# Patient Record
Sex: Male | Born: 1970 | Race: Black or African American | Hispanic: No | Marital: Married | Smoking: Current every day smoker
Health system: Southern US, Community
[De-identification: ages and names within clinical notes are randomized; demographics above are authoritative.]

## PROBLEM LIST (undated history)

## (undated) DIAGNOSIS — R0602 Shortness of breath: Secondary | ICD-10-CM

## (undated) DIAGNOSIS — R002 Palpitations: Principal | ICD-10-CM

## (undated) DIAGNOSIS — Z720 Tobacco use: Secondary | ICD-10-CM

## (undated) DIAGNOSIS — G4730 Sleep apnea, unspecified: Secondary | ICD-10-CM

## (undated) HISTORY — DX: Shortness of breath: R06.02

## (undated) HISTORY — DX: Sleep apnea, unspecified: G47.30

## (undated) HISTORY — DX: Tobacco use: Z72.0

## (undated) HISTORY — DX: Palpitations: R00.2

---

## 2006-02-04 ENCOUNTER — Emergency Department (HOSPITAL_COMMUNITY): Admission: EM | Admit: 2006-02-04 | Discharge: 2006-02-04 | Payer: Self-pay | Admitting: Emergency Medicine

## 2014-04-25 ENCOUNTER — Other Ambulatory Visit: Payer: Self-pay | Admitting: Gastroenterology

## 2014-04-25 DIAGNOSIS — R1084 Generalized abdominal pain: Secondary | ICD-10-CM

## 2014-05-01 ENCOUNTER — Ambulatory Visit
Admission: RE | Admit: 2014-05-01 | Discharge: 2014-05-01 | Disposition: A | Discharge status: Home / Self Care | Payer: Federal, State, Local not specified - PPO | Source: Ambulatory Visit | Attending: Gastroenterology | Admitting: Gastroenterology

## 2014-05-01 DIAGNOSIS — R1084 Generalized abdominal pain: Secondary | ICD-10-CM

## 2014-05-01 MED ORDER — IOHEXOL 300 MG/ML  SOLN
100.0000 mL | Freq: Once | INTRAMUSCULAR | Status: AC | PRN
  Administered 2014-05-01: 100 mL via INTRAVENOUS

## 2015-05-15 IMAGING — CT CT ABD-PELV W/ CM
2 of 5 series · 17 of 46 positions shown, 19 images · IV contrast ([ID] OMNI 300)
Comparison: None.

CLINICAL DATA: Mid abdominal pain.  Weight loss.  Diarrhea.

EXAM:
CT ABDOMEN AND PELVIS WITH CONTRAST
TECHNIQUE: Multidetector CT imaging of the abdomen and pelvis was performed
using the standard protocol following bolus administration of
intravenous contrast.
CONTRAST:  100mL OMNIPAQUE IOHEXOL 300 MG/ML  SOLN

[Series 2: abd/pelvis with · axial · 0.70mm/px · z∈[-366,+24]mm · 14 of 89 slices shown, 16 images]
[im 6/89  soft-tissue]
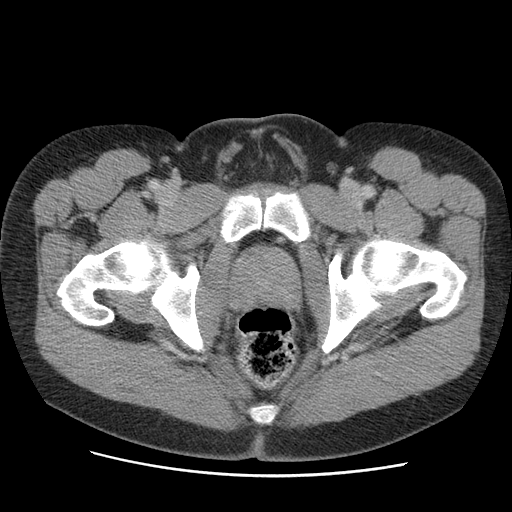
[im 6/89  bone]
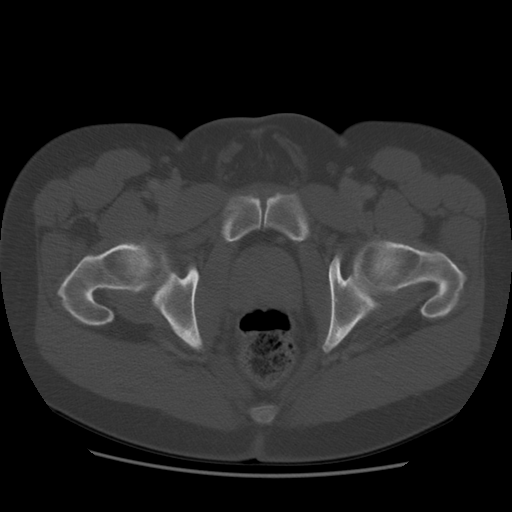
[im 11/89  soft-tissue]
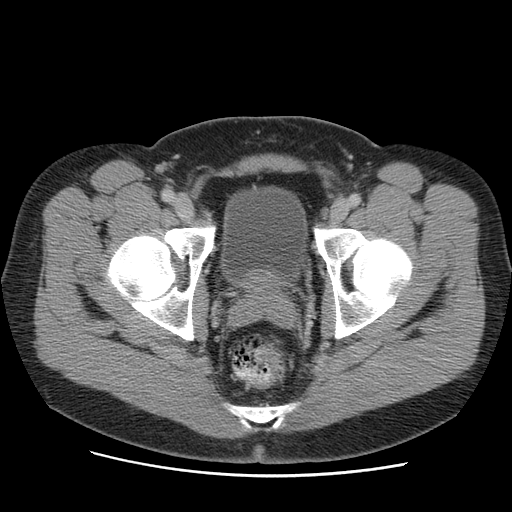
[im 16/89  soft-tissue]
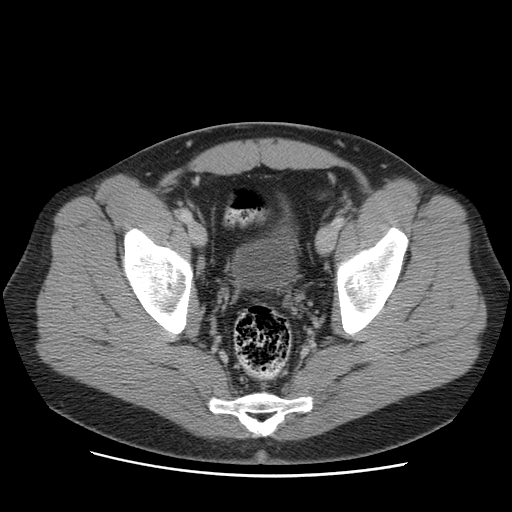
[im 26/89  soft-tissue]
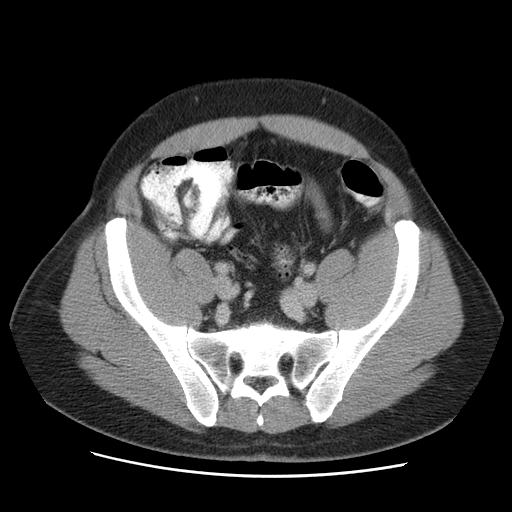
[im 32/89  soft-tissue]
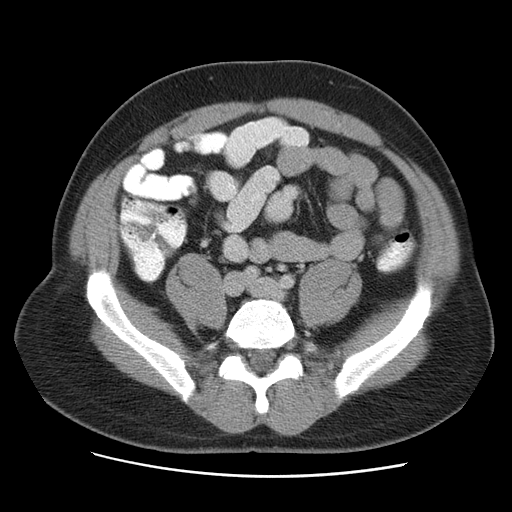
[im 37/89  soft-tissue]
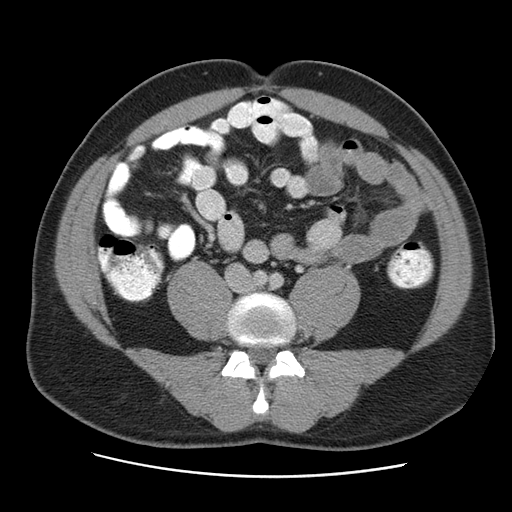
[im 42/89  soft-tissue]
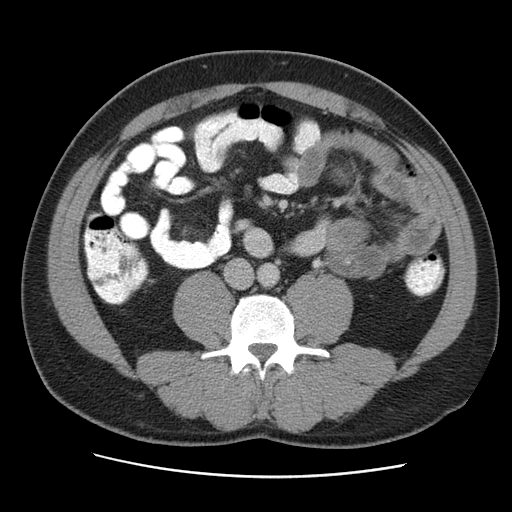
[im 47/89  soft-tissue]
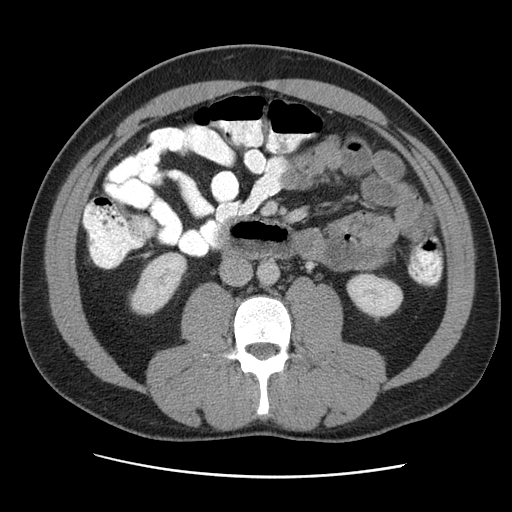
[im 52/89  soft-tissue]
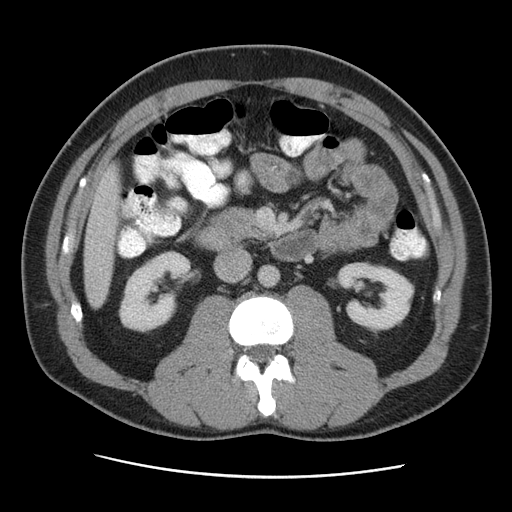
[im 52/89  bone]
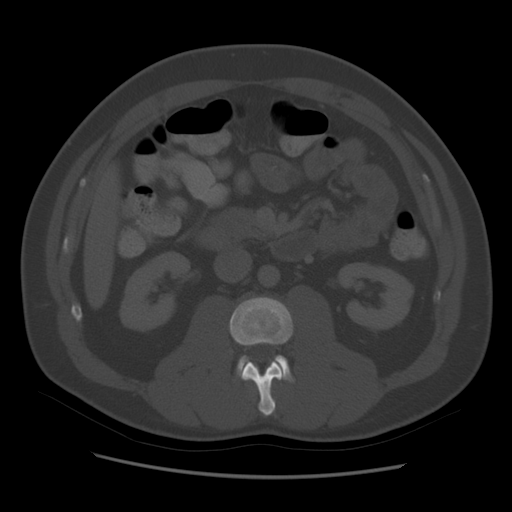
[im 57/89  soft-tissue]
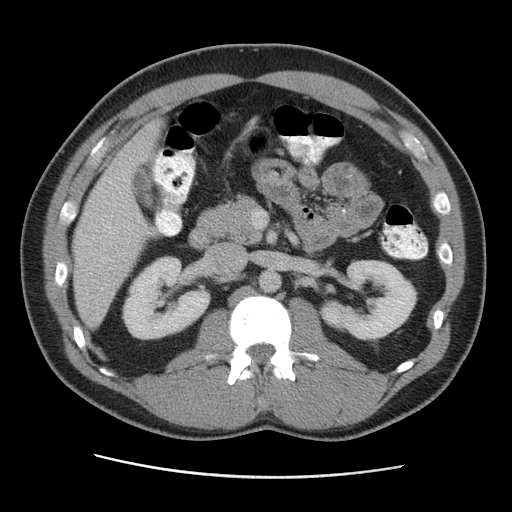
[im 68/89  soft-tissue]
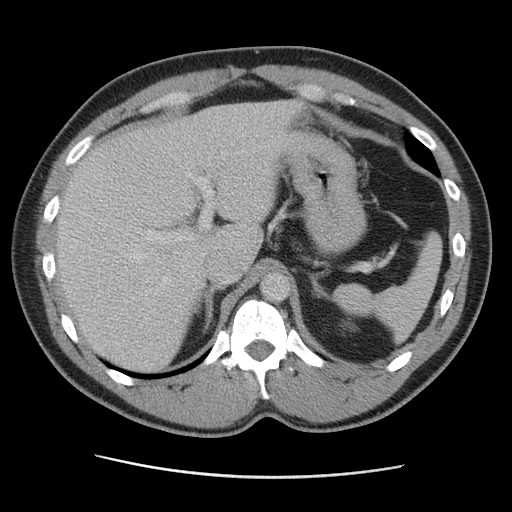
[im 73/89  soft-tissue]
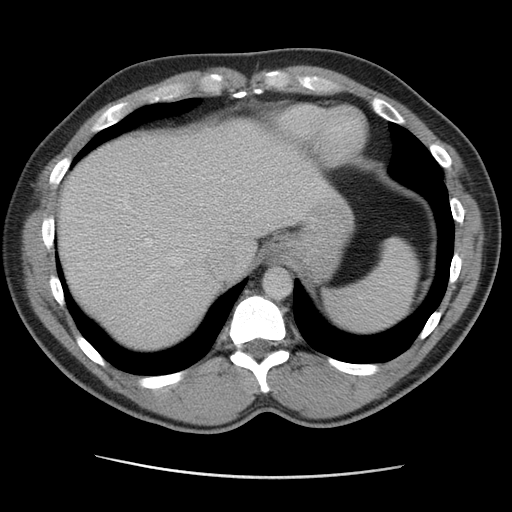
[im 78/89  soft-tissue]
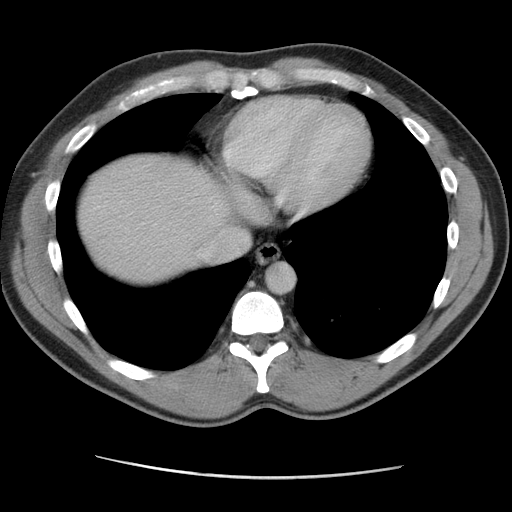
[im 83/89  soft-tissue]
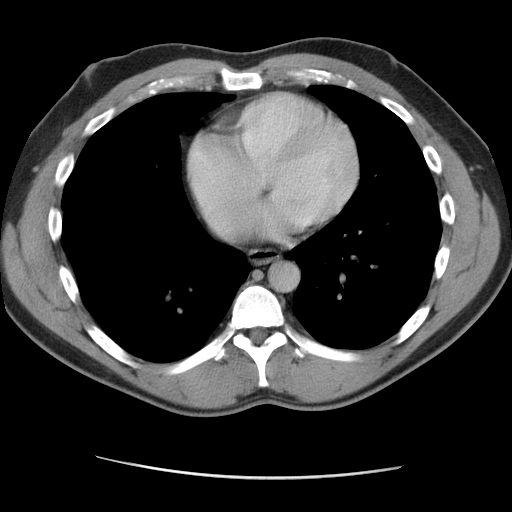

[Series 400: cor · coronal · 0.99mm/px · 3 of 136 slices shown]
[im 46/136  soft-tissue]
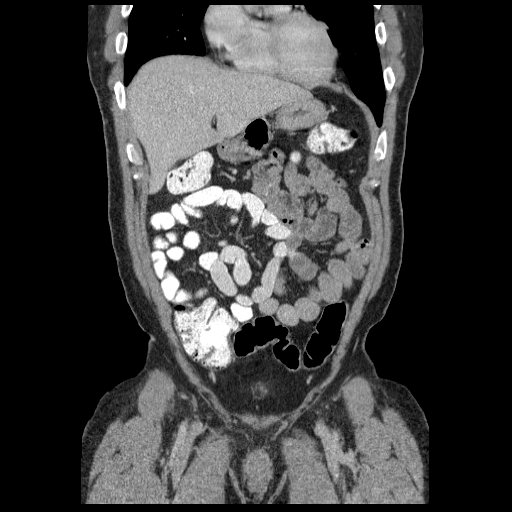
[im 61/136  soft-tissue]
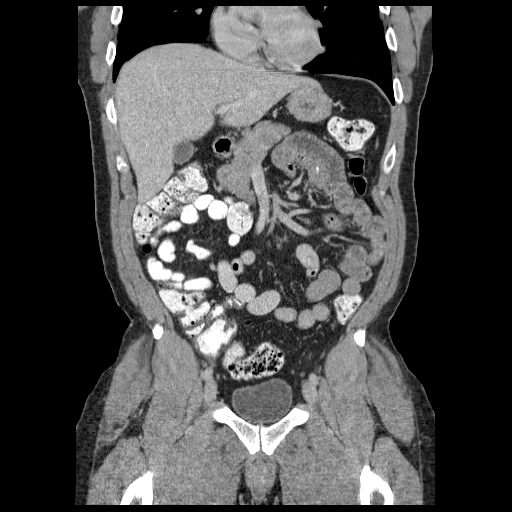
[im 76/136  soft-tissue]
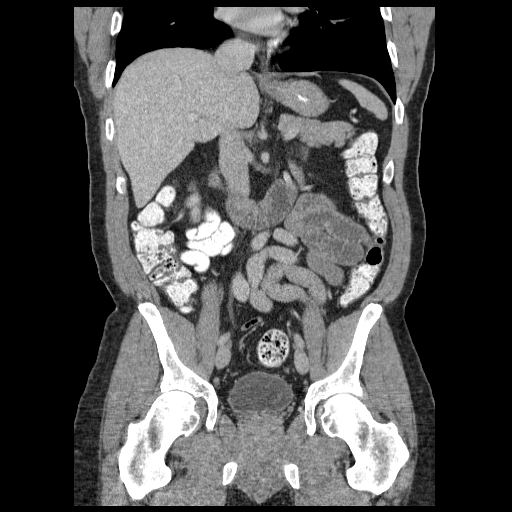

[17 of 46 positions shown; findings below may reference images not displayed]

FINDINGS: Lower chest:  Unremarkable

Hepatobiliary: Unremarkable

Pancreas: Unremarkable

Spleen: Unremarkable

Adrenals/Urinary Tract: Unremarkable

Stomach/Bowel: Frothy air- fluid levels distally in the colon
indicating diarrheal process. No significant abnormal colonic wall
thickening. Appendix normal.

Vascular/Lymphatic: Unremarkable

Reproductive: Unremarkable

Other: No supplemental non-categorized findings.

Musculoskeletal: Chronic fragmented spurring along the anterior
superior acetabulum bilaterally. Otherwise unremarkable.
IMPRESSION: 1. Frothy air- fluid levels in the distal colon indicating diarrheal
process, without colonic wall thickening.

## 2015-12-03 DIAGNOSIS — J302 Other seasonal allergic rhinitis: Secondary | ICD-10-CM | POA: Diagnosis not present

## 2016-09-29 DIAGNOSIS — R0602 Shortness of breath: Secondary | ICD-10-CM | POA: Diagnosis not present

## 2016-09-29 DIAGNOSIS — F17290 Nicotine dependence, other tobacco product, uncomplicated: Secondary | ICD-10-CM | POA: Diagnosis not present

## 2016-09-29 DIAGNOSIS — R0789 Other chest pain: Secondary | ICD-10-CM | POA: Diagnosis not present

## 2016-09-29 DIAGNOSIS — Z23 Encounter for immunization: Secondary | ICD-10-CM | POA: Diagnosis not present

## 2016-09-29 DIAGNOSIS — Z0000 Encounter for general adult medical examination without abnormal findings: Secondary | ICD-10-CM | POA: Diagnosis not present

## 2016-10-05 ENCOUNTER — Telehealth: Payer: Self-pay | Admitting: Cardiovascular Disease

## 2016-10-05 NOTE — Telephone Encounter (Signed)
Received records from [facility] for appointment on 10/19/16 with Dr Duke Salviaandolph.  Records put with Dr Leonides Sakeandolph's schedule for 10/19/16. lp

## 2016-10-19 ENCOUNTER — Encounter: Payer: Self-pay | Admitting: Cardiovascular Disease

## 2016-10-19 ENCOUNTER — Ambulatory Visit (INDEPENDENT_AMBULATORY_CARE_PROVIDER_SITE_OTHER): Payer: Federal, State, Local not specified - PPO | Admitting: Cardiovascular Disease

## 2016-10-19 VITALS — BP 124/77 | HR 80 | Ht 68.0 in | Wt 201.8 lb

## 2016-10-19 DIAGNOSIS — G4730 Sleep apnea, unspecified: Secondary | ICD-10-CM | POA: Diagnosis not present

## 2016-10-19 DIAGNOSIS — R002 Palpitations: Secondary | ICD-10-CM | POA: Diagnosis not present

## 2016-10-19 DIAGNOSIS — R0602 Shortness of breath: Secondary | ICD-10-CM

## 2016-10-19 DIAGNOSIS — Z720 Tobacco use: Secondary | ICD-10-CM

## 2016-10-19 DIAGNOSIS — R0681 Apnea, not elsewhere classified: Secondary | ICD-10-CM | POA: Diagnosis not present

## 2016-10-19 HISTORY — DX: Tobacco use: Z72.0

## 2016-10-19 HISTORY — DX: Palpitations: R00.2

## 2016-10-19 HISTORY — DX: Sleep apnea, unspecified: G47.30

## 2016-10-19 HISTORY — DX: Shortness of breath: R06.02

## 2016-10-19 NOTE — Progress Notes (Signed)
**Note De-Identified Webb Obfuscation** Cardiology Office Note   Date:  10/19/2016   ID:  Derrick Webb, DOB 07-20-1971, MRN [id number]  PCP:  Redmond Baseman, MD  Cardiologist:   Chilton Si, MD   Chief Complaint  Patient presents with  . New Patient (Initial Visit)  . Shortness of Breath    on exertion  . Dizziness    occasionally.  . Leg Pain    cramping in legs occasionally.      History of Present Illness: Derrick Webb is a 46 y.o. male who presents for an evaluation of chest pain, shortness of breath, and palpitations.  Derrick Webb saw Derrick Webb on 09/29/16 a reported chest pain. EKG at that time was unremarkable and his symptoms were felt more consistent with anxiety than ischemia. He was referred to cardiology for consideration of an ambulatory monitor.  For the last 4 months Derrick Webb has noted episodes of intermittent palpitations. He feels like his heart is out of rhythm. It last for 30 seconds to 1 minute. There is no associated shortness of breath, lightheadedness, dizziness, or chest pain. It typically occurs while at rest. Most recently it occurred 2 days ago while driving.  He does not drink caffeine or use over-the-counter cold and cough medications. He doesn't report any increased life stressors and has not been feeling anxious.  Derrick Webb walks for exercise 2-3 times per week. He notes some shortness of breath at the end of his walk. He sometimes has chest discomfort with yard work. This lasts for about a minute and improves with rest. There is no associated shortness of breath.  He notes that he snores heavily and that his wife has witnessed him having apneic episodes. His brother has sleep apnea and is not overweight. Derrick Webb has been smoking 2 cigars daily for the last 20 years. He is Chantix which did help him, but he started back smoking. He is not motivated to try quitting again this time.   Past Medical History:  Diagnosis Date  . Palpitations 10/19/2016  . Shortness of breath 10/19/2016  .  Sleep apnea 10/19/2016  . Tobacco abuse 10/19/2016    No past surgical history on file.   No current outpatient prescriptions on file.   No current facility-administered medications for this visit.     Allergies:   Patient has no allergy information on record.    Social History:  The patient  reports that he has been smoking.  He has never used smokeless tobacco.   Family History:  The patient's family history includes Dementia in his maternal grandmother and mother; Hypertension in his father; Sleep apnea in his brother.    ROS:  Please see the history of present illness.   Otherwise, review of systems are positive for none.   All other systems are reviewed and negative.    PHYSICAL EXAM: VS:  BP 124/77   Pulse 80   Ht 5\' 8"  (1.727 m)   Wt 91.5 kg (201 lb 12.8 oz)   BMI 30.68 kg/m  , BMI Body mass index is 30.68 kg/m. GENERAL:  Well appearing HEENT:  Pupils equal round and reactive, fundi not visualized, oral mucosa unremarkable NECK:  No jugular venous distention, waveform within normal limits, carotid upstroke brisk and symmetric, no bruits, no thyromegaly LYMPHATICS:  No cervical adenopathy LUNGS:  Clear to auscultation bilaterally HEART:  RRR.  PMI not displaced or sustained,S1 and S2 within normal limits, no S3, no S4, no clicks, no rubs, no  murmurs ABD:  Flat, positive bowel sounds normal in frequency in pitch, no bruits, no rebound, no guarding, no midline pulsatile mass, no hepatomegaly, no splenomegaly EXT:  2 plus pulses throughout, no edema, no cyanosis no clubbing SKIN:  No rashes no nodules NEURO:  Cranial nerves II through XII grossly intact, motor grossly intact throughout PSYCH:  Cognitively intact, oriented to person place and time   EKG:  EKG is not ordered today. 09/29/16: Sinus rhythm. Rate 62 bpm.    Recent Labs: No results found for requested labs within last 8760 hours.   09/29/16: WBC 5.0, hemoglobin 14.3, hematocrit 44.2, platelets 218 Sodium  139, potassium 4.8, BUN 9, creatinine 1.03 Total cholesterol 126, triglycerides 86, HDL 51, LDL 57 TSH 0.85  Lipid Panel No results found for: CHOL, TRIG, HDL, CHOLHDL, VLDL, LDLCALC, LDLDIRECT    Wt Readings from Last 3 Encounters:  10/19/16 91.5 kg (201 lb 12.8 oz)      ASSESSMENT AND PLAN:  # Palpitations:  # Apnea: Unclear etiology. It does sound like he has sleep apnea, raising the risk of atrial fibrillation or other atrial arrhythmias. We will obtain a sleep study. We will also get a 30 day event monitor. He is artery had the appropriate laboratory testing earlier this month, which was unremarkable.   # Chest pain: Symptoms are atypical and he has very few risk factors. We will treat the above and if he continues to have symptoms consider an ischemia evaluation.    Current medicines are reviewed at length with the patient today.  The patient does not have concerns regarding medicines.  The following changes have been made:  no change  Labs/ tests ordered today include:(New onset  Orders Placed This Encounter  Procedures  . Cardiac event monitor  . Split night study     Disposition:   FU with Trey Gulbranson C. Duke Salviaandolph, MD, [facility] in 2 months.    This note was written with the assistance of speech recognition software.  Please excuse any transcriptional errors.  Signed, Donn Wilmot C. Duke Salviaandolph, MD, [facility]  10/19/2016 4:22 PM    [facility]

## 2016-10-19 NOTE — Patient Instructions (Signed)
Procedures/Testing: Your physician has recommended that you wear an 30 day event monitor. Event monitors are medical devices that record the heart's electrical activity. Doctors most often us these monitors to diagnose arrhythmias. Arrhythmias are problems with the speed or rhythm of the heartbeat. The monitor is a small, portable device. You can wear one while you do your normal daily activities. This is usually used to diagnose what is causing palpitations/syncope (passing out). This will be placed at [address]  Your physician has recommended that you have a sleep study. This test records several body functions during sleep, including: brain activity, eye movement, oxygen and carbon dioxide blood levels, heart rate and rhythm, breathing rate and rhythm, the flow of air through your mouth and nose, snoring, body muscle movements, and chest and belly movement.    Follow-Up: Your physician recommends that you schedule a follow-up appointment in: 2 months with Dr. Duke Salviaandolph    If you need a refill on your cardiac medications before your next appointment, please call your pharmacy.

## 2016-10-20 ENCOUNTER — Telehealth: Payer: Self-pay | Admitting: Cardiovascular Disease

## 2016-10-20 NOTE — Telephone Encounter (Signed)
Left message for patient to call about sleep study.  It is scheduled for May 13 at 8:00 pm.  Sleep Clinic will mail him a packet of information.

## 2016-10-31 ENCOUNTER — Ambulatory Visit (INDEPENDENT_AMBULATORY_CARE_PROVIDER_SITE_OTHER): Payer: Federal, State, Local not specified - PPO

## 2016-10-31 DIAGNOSIS — R002 Palpitations: Secondary | ICD-10-CM | POA: Diagnosis not present

## 2016-12-11 ENCOUNTER — Ambulatory Visit (HOSPITAL_BASED_OUTPATIENT_CLINIC_OR_DEPARTMENT_OTHER): Payer: Federal, State, Local not specified - PPO | Attending: Cardiovascular Disease | Admitting: Cardiovascular Disease

## 2016-12-11 VITALS — Ht 68.0 in | Wt 198.0 lb

## 2016-12-11 DIAGNOSIS — G4730 Sleep apnea, unspecified: Secondary | ICD-10-CM | POA: Diagnosis present

## 2016-12-11 DIAGNOSIS — G4733 Obstructive sleep apnea (adult) (pediatric): Secondary | ICD-10-CM | POA: Diagnosis not present

## 2016-12-12 DIAGNOSIS — R197 Diarrhea, unspecified: Secondary | ICD-10-CM | POA: Diagnosis not present

## 2016-12-23 ENCOUNTER — Encounter: Payer: Self-pay | Admitting: Cardiovascular Disease

## 2016-12-23 ENCOUNTER — Ambulatory Visit (INDEPENDENT_AMBULATORY_CARE_PROVIDER_SITE_OTHER): Payer: Federal, State, Local not specified - PPO | Admitting: Cardiovascular Disease

## 2016-12-23 VITALS — BP 127/76 | HR 85 | Ht 68.0 in | Wt 196.6 lb

## 2016-12-23 DIAGNOSIS — Z720 Tobacco use: Secondary | ICD-10-CM | POA: Diagnosis not present

## 2016-12-23 DIAGNOSIS — R002 Palpitations: Secondary | ICD-10-CM | POA: Diagnosis not present

## 2016-12-23 DIAGNOSIS — R0681 Apnea, not elsewhere classified: Secondary | ICD-10-CM | POA: Diagnosis not present

## 2016-12-23 NOTE — Patient Instructions (Addendum)
Medication Instructions:  NO CHANGE  Labwork: NONE   Testing/Procedures: NONE  Follow-Up: AS NEEDED   WE WILL BE IN TOUCH AFTER DR KELLY READS YOUR SLEEP STUDY

## 2016-12-23 NOTE — Progress Notes (Signed)
Cardiology Office Note   Date:  12/23/2016   ID:  Derrick LeiterDerek A Alessio, DOB 12-03-1970, MRN [id number]  PCP:  Ileana LaddWong, Francis P, MD  Cardiologist:   Chilton Siiffany [place], MD   Chief Complaint  Patient presents with  . Follow-up    2 MONTHS;  Pt states no Sx.       History of Present Illness: Derrick Webb is a 46 y.o. male with sleep apnea and tobacco abuse who presents for follow up.  He was initially seen 09/2016 for an evaluation of chest pain, shortness of breath, and palpitations.  Mr. Kathrin Greathouseolk saw Dr. Caryn BeeKevin Via on 09/29/16 a reported chest pain. EKG at that time was unremarkable and his symptoms were felt more consistent with anxiety than ischemia.  He was referred for a 30 day event monitor 10/2016 that did not show any arrhythmias.  He notes that the monitor patches came off frequently and it malfunctioned often.  He had a sleep study but the results are not yet available.  He notes that during the study the attendant came and fitted him for a mask.  Mr. Kathrin Greathouseolk has not been experiencing recurrent chest pain.  His palpitations have been much better controlled.  He denies shortness of breath, lower extremity edema, orthopnea or PND.     Past Medical History:  Diagnosis Date  . Palpitations 10/19/2016  . Shortness of breath 10/19/2016  . Sleep apnea 10/19/2016  . Tobacco abuse 10/19/2016    No past surgical history on file.   No current outpatient prescriptions on file.   No current facility-administered medications for this visit.     Allergies:   Patient has no known allergies.    Social History:  The patient  reports that he has been smoking.  He has never used smokeless tobacco.   Family History:  The patient's family history includes Dementia in his maternal grandmother and mother; Hypertension in his father; Sleep apnea in his brother.    ROS:  Please see the history of present illness.   Otherwise, review of systems are positive for none.   All other systems are reviewed and negative.      PHYSICAL EXAM: VS:  BP 127/76   Pulse 85   Ht 5\' 8"  (1.727 m)   Wt 89.2 kg (196 lb 9.6 oz)   BMI 29.89 kg/m  , BMI Body mass index is 29.89 kg/m. GENERAL:  Well appearing.  No acute distress HEENT:  Pupils equal round and reactive, fundi not visualized, oral mucosa unremarkable NECK:  No jugular venous distention, waveform within normal limits, carotid upstroke brisk and symmetric, no bruits so I'll LUNGS:  Clear to auscultation bilaterally.  No crackles, rhonchi or wheezes HEART:  RRR.  PMI not displaced or sustained,S1 and S2 within normal limits, no S3, no S4, no clicks, no rubs, no murmurs ABD:  Flat, positive bowel sounds normal in frequency in pitch, no bruits, no rebound, no guarding, no midline pulsatile mass, no hepatomegaly, no splenomegaly EXT:  2 plus pulses throughout, no edema, no cyanosis no clubbing SKIN:  No rashes no nodules NEURO:  Cranial nerves II through XII grossly intact, motor grossly intact throughout PSYCH:  Cognitively intact, oriented to person place and time  EKG:  EKG is not ordered today. 09/29/16: Sinus rhythm. Rate 62 bpm.    Recent Labs: No results found for requested labs within last 8760 hours.   09/29/16: WBC 5.0, hemoglobin 14.3, hematocrit 44.2, platelets 218 Sodium 139, potassium 4.8,  BUN 9, creatinine 1.03 Total cholesterol 126, triglycerides 86, HDL 51, LDL 57 TSH 0.85  Lipid Panel No results found for: CHOL, TRIG, HDL, CHOLHDL, VLDL, LDLCALC, LDLDIRECT    Wt Readings from Last 3 Encounters:  12/23/16 89.2 kg (196 lb 9.6 oz)  12/11/16 89.8 kg (198 lb)  10/19/16 91.5 kg (201 lb 12.8 oz)      ASSESSMENT AND PLAN:  # Apnea: Based on the fact that the attendant came to fit him for a mask it seems like he does not have sleep apnea.  Final read pending.    # Palpitations: Resolved.  # Chest pain: Resolved   Current medicines are reviewed at length with the patient today.  The patient does not have concerns regarding  medicines.  The following changes have been made:  no change  Labs/ tests ordered today include:  No orders of the defined types were placed in this encounter.    Disposition:   FU with Kaycie Pegues C. Duke Salvia, MD, [facility] as needed   This note was written with the assistance of speech recognition software.  Please excuse any transcriptional errors.  Signed, Oswald Pott C. Duke Salvia, MD, [facility]  12/23/2016 4:49 PM    [facility]

## 2017-01-08 NOTE — Procedures (Signed)
Patient Name: Derrick Webb, Derrick Webb Date: 12/11/2016 Gender: Male D.O.B: [birth year] Age (years): 45 Referring Provider: Skeet Latch Height (inches): 66 Interpreting Physician: Shelva Majestic MD, ABSM Weight (lbs): 198 RPSGT: Madelon Lips BMI: 30 MRN: [id number] Neck Size: 16.00  CLINICAL INFORMATION Sleep Study Type: Split Night CPAP  Indication for sleep study: OSA  Epworth Sleepiness Score: 8  SLEEP STUDY TECHNIQUE As per the AASM Manual for the Scoring of Sleep and Associated Events v2.3 (April 2016) with a hypopnea requiring 4% desaturations.  The channels recorded and monitored were frontal, central and occipital EEG, electrooculogram (EOG), submentalis EMG (chin), nasal and oral airflow, thoracic and abdominal wall motion, anterior tibialis EMG, snore microphone, electrocardiogram, and pulse oximetry. Continuous positive airway pressure (CPAP) was initiated when the patient met split night criteria and was titrated according to treat sleep-disordered breathing.  MEDICATIONS None Medications self-administered by patient taken the night of the study : N/A  RESPIRATORY PARAMETERS  Diagnostic Total AHI (/hr): 35.9 RDI (/hr): 36.8 OA Index (/hr): 1.4 CA Index (/hr): 0.0 REM AHI (/hr): 62.2 NREM AHI (/hr): 32.7 Supine AHI (/hr): 49.7 Non-supine AHI (/hr): 16.98 Min O2 Sat (%): 83.00 Mean O2 (%): 93.35 Time below 88% (min): 4.9    Titration Optimal Pressure (cm): 11 AHI at Optimal Pressure (/hr): 3.5 Min O2 at Optimal Pressure (%): 92.0 Supine % at Optimal (%): 100 Sleep % at Optimal (%): 99    SLEEP ARCHITECTURE The recording time for the entire night was 379.5 minutes.  During a baseline period of 186.3 minutes, the patient slept for 125.5 minutes in REM and nonREM, yielding a sleep efficiency of 67.4%. Sleep onset after lights out was 38.3 minutes with a REM latency of 127.5 minutes. The patient spent 15.54% of the night in stage N1 sleep, 73.71% in stage N2  sleep, 0.00% in stage N3 and 10.76% in REM.  During the titration period of 188.4 minutes, the patient slept for 151.0 minutes in REM and nonREM, yielding a sleep efficiency of 80.1%. Sleep onset after CPAP initiation was 15.7 minutes with a REM latency of 42.5 minutes. The patient spent 19.21% of the night in stage N1 sleep, 48.34% in stage N2 sleep, 0.00% in stage N3 and 32.45% in REM.  CARDIAC DATA The 2 lead EKG demonstrated sinus rhythm. The mean heart rate was 66.75 beats per minute. Other EKG findings include: None.  LEG MOVEMENT DATA The total Periodic Limb Movements of Sleep (PLMS) were 0. The PLMS index was 0.00 .  IMPRESSIONS - Severe obstructive sleep apnea occurred during the diagnostic portion of the study (AHI 35.9/hour).  Events were more severe with supine position (AHI 49.7/h) and during REM sleep (AHI of 62.2/h).  CPAP was titrated up to 11 cm water pressure with an AHI of 3.5 and O2 nadir of 92%. - No significant central sleep apnea occurred during the diagnostic portion of the study (CAI = 0.0/hour). - The patient had moderate desaturation during the diagnostic portion of the study to a nadir of  83%. - The patient snored with Loud snoring volume during the diagnostic portion of the study. - No cardiac abnormalities were noted during this study. - Clinically significant periodic limb movements did not occur during sleep.  DIAGNOSIS - Obstructive Sleep Apnea (327.23 [G47.33 ICD-10])  RECOMMENDATIONS - Recommend an initial trial of CPAP therapy on 11 cm H2O with heated humidification.  A Large size Resmed Full Face Mask AirFit F20 mask was used for the titration trial. - Efforts should  be made to optimize nasal and oral pharyngeal patency. - Avoid alcohol, sedatives and other CNS depressants that may worsen sleep apnea and disrupt normal sleep architecture. - Sleep hygiene should be reviewed to assess factors that may improve sleep quality. - Weight management and regular  exercise should be initiated or continued. - Recommend a download be obtained in 30 days and sleep clinic evaluation after 4 weeks of therapy.  [Electronically signed] 01/08/2017 04:00 PM  Shelva Majestic MD, [facility], [place], American Board of Sleep Medicine   NPI: [id number] [place] PH: [phone]   FX: [phone] [address]

## 2017-09-15 DIAGNOSIS — Z733 Stress, not elsewhere classified: Secondary | ICD-10-CM | POA: Diagnosis not present

## 2017-09-15 DIAGNOSIS — F17200 Nicotine dependence, unspecified, uncomplicated: Secondary | ICD-10-CM | POA: Insufficient documentation

## 2017-09-15 DIAGNOSIS — F322 Major depressive disorder, single episode, severe without psychotic features: Secondary | ICD-10-CM | POA: Diagnosis not present

## 2017-09-15 DIAGNOSIS — Z046 Encounter for general psychiatric examination, requested by authority: Secondary | ICD-10-CM | POA: Diagnosis not present

## 2017-09-15 DIAGNOSIS — F329 Major depressive disorder, single episode, unspecified: Secondary | ICD-10-CM | POA: Diagnosis not present

## 2017-09-15 DIAGNOSIS — R45851 Suicidal ideations: Secondary | ICD-10-CM | POA: Diagnosis not present

## 2017-09-15 DIAGNOSIS — R259 Unspecified abnormal involuntary movements: Secondary | ICD-10-CM | POA: Diagnosis not present

## 2017-09-16 ENCOUNTER — Encounter (HOSPITAL_COMMUNITY): Payer: Self-pay

## 2017-09-16 ENCOUNTER — Encounter (HOSPITAL_COMMUNITY): Payer: Self-pay | Admitting: Emergency Medicine

## 2017-09-16 ENCOUNTER — Emergency Department (HOSPITAL_COMMUNITY)
Admission: EM | Admit: 2017-09-16 | Discharge: 2017-09-16 | Disposition: A | Discharge status: Other Acute Inpatient Hospital | Payer: Federal, State, Local not specified - PPO | Attending: Emergency Medicine | Admitting: Emergency Medicine

## 2017-09-16 ENCOUNTER — Inpatient Hospital Stay (HOSPITAL_COMMUNITY)
Admission: AD | Admit: 2017-09-16 | Discharge: 2017-09-20 | DRG: 885 | Disposition: A | Discharge status: Home / Self Care | Payer: Federal, State, Local not specified - PPO | Attending: Psychiatry | Admitting: Psychiatry

## 2017-09-16 ENCOUNTER — Other Ambulatory Visit: Payer: Self-pay

## 2017-09-16 DIAGNOSIS — G4730 Sleep apnea, unspecified: Secondary | ICD-10-CM | POA: Diagnosis present

## 2017-09-16 DIAGNOSIS — F411 Generalized anxiety disorder: Secondary | ICD-10-CM

## 2017-09-16 DIAGNOSIS — F322 Major depressive disorder, single episode, severe without psychotic features: Secondary | ICD-10-CM

## 2017-09-16 DIAGNOSIS — Z720 Tobacco use: Secondary | ICD-10-CM

## 2017-09-16 DIAGNOSIS — Z23 Encounter for immunization: Secondary | ICD-10-CM

## 2017-09-16 DIAGNOSIS — R259 Unspecified abnormal involuntary movements: Secondary | ICD-10-CM | POA: Diagnosis not present

## 2017-09-16 DIAGNOSIS — F17200 Nicotine dependence, unspecified, uncomplicated: Secondary | ICD-10-CM | POA: Diagnosis not present

## 2017-09-16 DIAGNOSIS — R45851 Suicidal ideations: Secondary | ICD-10-CM | POA: Diagnosis not present

## 2017-09-16 DIAGNOSIS — F329 Major depressive disorder, single episode, unspecified: Secondary | ICD-10-CM | POA: Diagnosis not present

## 2017-09-16 DIAGNOSIS — Z046 Encounter for general psychiatric examination, requested by authority: Secondary | ICD-10-CM | POA: Diagnosis not present

## 2017-09-16 DIAGNOSIS — G4700 Insomnia, unspecified: Secondary | ICD-10-CM | POA: Diagnosis not present

## 2017-09-16 DIAGNOSIS — Z630 Problems in relationship with spouse or partner: Secondary | ICD-10-CM | POA: Diagnosis not present

## 2017-09-16 DIAGNOSIS — F17210 Nicotine dependence, cigarettes, uncomplicated: Secondary | ICD-10-CM | POA: Diagnosis not present

## 2017-09-16 DIAGNOSIS — Z810 Family history of intellectual disabilities: Secondary | ICD-10-CM | POA: Diagnosis not present

## 2017-09-16 DIAGNOSIS — Z733 Stress, not elsewhere classified: Secondary | ICD-10-CM | POA: Diagnosis not present

## 2017-09-16 LAB — CBC
HCT: 42.4 % (ref 39.0–52.0)
Hemoglobin: 13.7 g/dL (ref 13.0–17.0)
MCH: 29.1 pg (ref 26.0–34.0)
MCHC: 32.3 g/dL (ref 30.0–36.0)
MCV: 90.2 fL (ref 78.0–100.0)
Platelets: 230 10*3/uL (ref 150–400)
RBC: 4.7 MIL/uL (ref 4.22–5.81)
RDW: 12.5 % (ref 11.5–15.5)
WBC: 5.5 10*3/uL (ref 4.0–10.5)

## 2017-09-16 LAB — RAPID URINE DRUG SCREEN, HOSP PERFORMED
Amphetamines: NOT DETECTED
BARBITURATES: NOT DETECTED
BENZODIAZEPINES: NOT DETECTED
Cocaine: NOT DETECTED
Opiates: NOT DETECTED
Tetrahydrocannabinol: NOT DETECTED

## 2017-09-16 LAB — COMPREHENSIVE METABOLIC PANEL
ALBUMIN: 4.6 g/dL (ref 3.5–5.0)
ALK PHOS: 48 U/L (ref 38–126)
ALT: 15 U/L — ABNORMAL LOW (ref 17–63)
AST: 13 U/L — AB (ref 15–41)
Anion gap: 7 (ref 5–15)
BILIRUBIN TOTAL: 0.5 mg/dL (ref 0.3–1.2)
BUN: 12 mg/dL (ref 6–20)
CALCIUM: 9 mg/dL (ref 8.9–10.3)
CO2: 26 mmol/L (ref 22–32)
CREATININE: 1.23 mg/dL (ref 0.61–1.24)
Chloride: 106 mmol/L (ref 101–111)
GFR calc Af Amer: >60 mL/min (ref >60)
Glucose, Bld: 116 mg/dL — ABNORMAL HIGH (ref 65–99)
Potassium: 4.3 mmol/L (ref 3.5–5.1)
Sodium: 139 mmol/L (ref 135–145)
TOTAL PROTEIN: 7.5 g/dL (ref 6.5–8.1)

## 2017-09-16 LAB — ACETAMINOPHEN LEVEL: Acetaminophen (Tylenol), Serum: <10 ug/mL — ABNORMAL LOW (ref 10–30)

## 2017-09-16 LAB — SALICYLATE LEVEL: Salicylate Lvl: <7 mg/dL (ref 2.8–30.0)

## 2017-09-16 LAB — ETHANOL: Alcohol, Ethyl (B): <10 mg/dL (ref <10)

## 2017-09-16 MED ORDER — INFLUENZA VAC SPLIT QUAD 0.5 ML IM SUSY
0.5000 mL | PREFILLED_SYRINGE | INTRAMUSCULAR | Status: AC
  Administered 2017-09-17: 0.5 mL via INTRAMUSCULAR
  Filled 2017-09-16: qty 0.5

## 2017-09-16 MED ORDER — HYDROXYZINE HCL 25 MG PO TABS: 25.0000 mg | ORAL_TABLET | Freq: Every day | ORAL | Status: DC

## 2017-09-16 MED ORDER — ALUM & MAG HYDROXIDE-SIMETH 200-200-20 MG/5ML PO SUSP: 30.0000 mL | ORAL | Status: DC | PRN

## 2017-09-16 MED ORDER — HYDROXYZINE HCL 25 MG PO TABS: 25.0000 mg | ORAL_TABLET | Freq: Three times a day (TID) | ORAL | Status: DC | PRN

## 2017-09-16 MED ORDER — HYDROXYZINE HCL 25 MG PO TABS
25.0000 mg | ORAL_TABLET | Freq: Every day | ORAL | Status: DC
  Filled 2017-09-16 (×7): qty 1

## 2017-09-16 MED ORDER — CITALOPRAM HYDROBROMIDE 10 MG PO TABS
10.0000 mg | ORAL_TABLET | Freq: Every day | ORAL | Status: DC
  Administered 2017-09-16: 10 mg via ORAL
  Filled 2017-09-16: qty 1

## 2017-09-16 MED ORDER — ACETAMINOPHEN 325 MG PO TABS
650.0000 mg | ORAL_TABLET | Freq: Four times a day (QID) | ORAL | Status: DC | PRN
  Administered 2017-09-19: 650 mg via ORAL
  Filled 2017-09-16: qty 2

## 2017-09-16 MED ORDER — CITALOPRAM HYDROBROMIDE 10 MG PO TABS
10.0000 mg | ORAL_TABLET | Freq: Every day | ORAL | Status: DC
  Filled 2017-09-16 (×3): qty 1

## 2017-09-16 MED ORDER — TRAZODONE HCL 50 MG PO TABS
50.0000 mg | ORAL_TABLET | Freq: Every evening | ORAL | Status: DC | PRN
  Filled 2017-09-16: qty 1

## 2017-09-16 MED ORDER — MAGNESIUM HYDROXIDE 400 MG/5ML PO SUSP: 30.0000 mL | Freq: Every day | ORAL | Status: DC | PRN

## 2017-09-16 NOTE — Progress Notes (Signed)
CSW completed first exam and provided to [facility].  Please reconsult if future social work needs arise.  CSW signing off, as social work intervention is no longer needed.  Dorothe PeaJonathan F. Kelbie Moro, LCSW, LCAS, CSI Clinical Social Worker Ph: [phone]

## 2017-09-16 NOTE — BH Assessment (Signed)
[facility]  09/16/17: Per Corlis HoveAC Tate, patient has been [id number].

## 2017-09-16 NOTE — Progress Notes (Signed)
Patient ID: Derrick Webb, male   DOB: [birth year], 47 y.o.   MRN: [id number]  Pt currently presents with a flat affect and cooperative behavior. Pt reports to writer that he is aware "I am here because of what I said." Pt reports that he often makes passive SI statements in arguments with his wife because his wife usually stops yelling when he does this. Pt then endorses regret, states "I am now being punished, my wife and my daughter are too." Pt reports that he does not want to take medications and that the antidepressant he took today, "would not make me feel any better if it was already working." Pt reports good sleep PTA, refuses sleep aid.   Pt provided with medications per providers orders. Pt's labs and vitals were monitored throughout the night. Pt given a 1:1 about emotional and mental status. Pt supported and encouraged to express concerns and questions. Pt educated on medications, therapeutic effects and assertiveness techniques.   Pt's safety ensured with 15 minute and environmental checks. Pt currently denies SI/HI and A/V hallucinations. Pt verbally agrees to seek staff if SI/HI or A/VH occurs and to consult with staff before acting on any harmful thoughts. Will continue POC.

## 2017-09-16 NOTE — ED Notes (Signed)
SBAR Report received from previous nurse. Pt received calm and visible on unit. Pt denies currentHI, A/V H, depression, anxiety, or pain at this time, and appears otherwise stable and free of distress. Pt reminded of camera surveillance, q 15 min rounds, and rules of the milieu. Will continue to assess. 

## 2017-09-16 NOTE — BH Assessment (Addendum)
Assessment Note  Derrick Webb is an 47 y.o. male, who presents involuntary and unaccompanied to [facility]. Clinician asked the pt, "what brought you to the hospital?" Pt reported, "I had a breakdown, I said things to my wife." Pt reported, he told his wife if it wasn't for their daughter he probably would has hurt himself. Pt reported, after arguing with his wife, got in his car and left. Pt reported, the police picked him up at [facility] and brought him to the hospital. Pt reported, he and his wife have a strained relationship because years ago his wife seen on a bank statement that he paid for a hotel room. Pt reported, he never used the room but his initial intentions were to use the hotel room to cheat. Pt reported, he came clean to his wife and it seemed like the were moving forward. Pt reported, when he asks his wife to talk about the status of their marriage she wants to put it off which leads to an argument. Pt reported, about a month or two ago he was suicidal because while arguing his wife she said was going to get a divorce and take him for everything. Pt reported, access to a Taurus 45 (gun). Pt denies current SI, HI, AVH, self-injurious behaviors.   Pt's wife initiated IVC paperwork. Clinician contacted pt's wife to obtain collateral information. Per wife, the pt told her he was having suicidal thoughts, to either shoot himself with a gun or drive off and kil himself. Pt's wife reported, the pt said he wanted to kill himself over and over again. Pt's wife reported, she removed the gun from the home. Pt's wife reported, she does not think the pt is safe outside of the hospital. Per IVC paperwork: "My husband is threatening to do himself harm with a gun or car. He had a gun earlier tonight saying over and over that he wanted to kill himself. I took the gun away and he took out car, saying he was going to run it in a ditch to kill himself. I called 911 and the sheriffs pinged the car and found it  parked in a wooded area. They suggested that I come down and fill out IVC forms." Pt denied the content of the IVC paperwork.  Pt denies abuse. Pt reported, smoking cigars and drinking alcohol on occasion. Pt's UDS is negative. Pt denies, being linked to OPT resources (medication management and/or counseling.) Pt denies, previous inpatient admissions.   Pt presents quiet/awake in scrubs with logical/coherent speech. Pt's eye contact was good. Pt's mood was sad. Pt's affect was congruent with mood. Pt's thought process was coherent/relevant. Pt's judgement was unimpaired. Pt was oriented x4. Pt's concentration was normal. Pt's insight was fair. Pt's impulse control was poor.   Diagnosis: F33.2 Major Depressive Disorder, recurrent, severe without psychotic features.   Past Medical History:  Past Medical History:  Diagnosis Date  . Palpitations 10/19/2016  . Shortness of breath 10/19/2016  . Sleep apnea 10/19/2016  . Tobacco abuse 10/19/2016    History reviewed. No pertinent surgical history.  Family History:  Family History  Problem Relation Age of Onset  . Dementia Mother   . Hypertension Father   . Dementia Maternal Grandmother   . Sleep apnea Brother     Social History:  reports that he has been smoking.  he has never used smokeless tobacco. He reports that he drinks alcohol. He reports that he does not use drugs.  Additional Social History:  Alcohol / Drug Use Pain Medications: See MAR Prescriptions: See MAR Over the Counter: See MAR History of alcohol / drug use?: Yes Substance #1 Name of Substance 1: Cigars. 1 - Age of First Use: UTA 1 - Amount (size/oz): Pt reported, smoking two cigars, daily.  1 - Frequency: Daily. 1 - Duration: Ongoing. 1 - Last Use / Amount: Pt repored, daily.  Substance #2 Name of Substance 2: Alcohol. 2 - Age of First Use: UTA 2 - Amount (size/oz): Pt reported, "three beers. 2 - Frequency: UTA 2 - Duration: UTA 2 - Last Use / Amount: Pt reported,  "two days on and two to three days off."   CIWA: CIWA-Ar BP: (!) 143/91 Pulse Rate: 82 COWS:    Allergies: No Known Allergies  Home Medications:  (Not in a hospital admission)  OB/GYN Status:  No LMP for male patient.  General Assessment Data Location of Assessment: [facility] TTS Assessment: In system Is this a Tele or Face-to-Face Assessment?: Face-to-Face Is this an Initial Assessment or a Re-assessment for this encounter?: Initial Assessment Marital status: Married Is patient pregnant?: No Pregnancy Status: No Living Arrangements: Children, Spouse/significant other Can pt return to current living arrangement?: Yes Admission Status: Involuntary Referral Source: Self/Family/Friend Insurance type: BCBS     Crisis Care Plan Living Arrangements: Children, Spouse/significant other Legal Guardian: Other:(Self. ) Name of Psychiatrist: NA Name of Therapist: NA  Education Status Is patient currently in school?: No Current Grade: NA Highest grade of school patient has completed: Pt reported, three years of college. Name of school: NA Contact person: NA  Risk to self with the past 6 months Suicidal Ideation: Yes-Currently Present(Per IVC. ) Has patient been a risk to self within the past 6 months prior to admission? : Yes Suicidal Intent: Yes-Currently Present(Per IVC. ) Has patient had any suicidal intent within the past 6 months prior to admission? : Yes Is patient at risk for suicide?: Yes Suicidal Plan?: Yes-Currently Present Has patient had any suicidal plan within the past 6 months prior to admission? : No(Pt denies. ) Specify Current Suicidal Plan: IVC paperwork: ptto do himself harm with a gun or car. Access to Means: Yes Specify Access to Suicidal Means: Pt has access to a Taurus .45 (gun) What has been your use of drugs/alcohol within the last 12 months?: Alcohol and cigars.  Previous Attempts/Gestures: No How many times?: 0 Other Self Harm Risks: Pt denies.   Triggers for Past Attempts: None known Intentional Self Injurious Behavior: None(Pt denies. ) Family Suicide History: No Recent stressful life event(s): Conflict (Comment)(conflict with wife seen that the pt was about to cheat onher) Persecutory voices/beliefs?: No Depression: Yes Depression Symptoms: Feeling angry/irritable, Feeling worthless/self pity, Loss of interest in usual pleasures, Guilt, Isolating, Tearfulness, Insomnia, Fatigue Substance abuse history and/or treatment for substance abuse?: No Suicide prevention information given to non-admitted patients: Not applicable  Risk to Others within the past 6 months Homicidal Ideation: No(Pt denies. ) Does patient have any lifetime risk of violence toward others beyond the six months prior to admission? : No(Pt denies. ) Thoughts of Harm to Others: No Current Homicidal Intent: No Current Homicidal Plan: No Access to Homicidal Means: No Identified Victim: NA History of harm to others?: No Assessment of Violence: None Noted Violent Behavior Description: NA Does patient have access to weapons?: (Taurus .45 (gun)) Criminal Charges Pending?: No Does patient have a court date: No Is patient on probation?: No  Psychosis Hallucinations: None noted Delusions: None noted  Mental  Status Report Appearance/Hygiene: In scrubs Eye Contact: Good Motor Activity: Unremarkable Speech: Logical/coherent Level of Consciousness: Quiet/awake Mood: Sad Affect: Flat(congruent with mood. ) Anxiety Level: Minimal Thought Processes: Coherent, Relevant Judgement: Unimpaired Orientation: Situation, Time, Place, Person Obsessive Compulsive Thoughts/Behaviors: None  Cognitive Functioning Concentration: Normal Memory: Recent Intact IQ: Average Insight: Fair Impulse Control: Poor Appetite: Good Sleep: Decreased Total Hours of Sleep: (Pt reported, 3-4 hours. ) Vegetative Symptoms: None  ADLScreening [facility] Assessment Services) Patient's  cognitive ability adequate to safely complete daily activities?: Yes Patient able to express need for assistance with ADLs?: Yes Independently performs ADLs?: Yes (appropriate for developmental age)  Prior Inpatient Therapy Prior Inpatient Therapy: No Prior Therapy Dates: NA Prior Therapy Facilty/Provider(s): NA Reason for Treatment: NA  Prior Outpatient Therapy Prior Outpatient Therapy: No Prior Therapy Dates: NA Prior Therapy Facilty/Provider(s): NA Reason for Treatment: NA Does patient have an ACCT team?: No Does patient have Intensive In-House Services?  : No Does patient have [facility] services? : No Does patient have P4CC services?: No  ADL Screening (condition at time of admission) Patient's cognitive ability adequate to safely complete daily activities?: Yes Is the patient deaf or have difficulty hearing?: No Does the patient have difficulty seeing, even when wearing glasses/contacts?: Yes(Pt reported, wearing glasses. ) Does the patient have difficulty concentrating, remembering, or making decisions?: No Patient able to express need for assistance with ADLs?: Yes Does the patient have difficulty dressing or bathing?: No Independently performs ADLs?: Yes (appropriate for developmental age) Does the patient have difficulty walking or climbing stairs?: No Weakness of Legs: None Weakness of Arms/Hands: None  Home Assistive Devices/Equipment Home Assistive Devices/Equipment: None    Abuse/Neglect Assessment (Assessment to be complete while patient is alone) Abuse/Neglect Assessment Can Be Completed: Yes Physical Abuse: Denies(Pt denies. ) Verbal Abuse: Denies(Pt denies. ) Sexual Abuse: Denies(Pt denies. ) Exploitation of patient/patient's resources: Denies(Pt denies. ) Self-Neglect: Denies(Pt denies. )     Advance Directives (For Healthcare) Does Patient Have a Medical Advance Directive?: No Would patient like information on creating a medical advance directive?: No -  Patient declined    Additional Information 1:1 In Past 12 Months?: No CIRT Risk: No Elopement Risk: No Does patient have medical clearance?: Yes     Disposition: Nira Conn, NP recommends inpatient treatment. Disposition discussed with Misty Stanley, PA and Joanie Coddington, RN. TTS to seek placement.   Disposition Initial Assessment Completed for this Encounter: Yes Disposition of Patient: Inpatient treatment program Type of inpatient treatment program: Adult  On Site Evaluation by:  Holly Bodily. Naveyah Iacovelli, MS, LPC, CRC. Reviewed with Physician:  Misty Stanley, [place] and Nira Conn, NP.  Redmond Pulling 09/16/2017 5:19 AM   Redmond Pulling, MS, [facility], [facility] Triage Specialist [phone]

## 2017-09-16 NOTE — Progress Notes (Addendum)
2:35 PM CSW received 1st exam/opinion from E[employer] and handed off to pt's RN.  Alonnie Bieker F. Lavella Myren, LCSW, LCAS, CSI Clinical Social Worker Ph: [phone]  

## 2017-09-16 NOTE — ED Triage Notes (Signed)
Pt brought in under IVC by police  Pt states his wife and him had an argument tonight and he made a comment of feeling like killing himself  Pt states he has had these thoughts before and the only reason he has not done anything is because of his daughter  Pt states he has felt like this off and on for about 2 years but has never acted on his thoughts and denies a plan  Pt's wife took out IVC paperwork  Pt is calm and cooperative in triage

## 2017-09-16 NOTE — Progress Notes (Signed)
Patient ID: Derrick Webb, male   DOB: 09-03-1970, 47 y.o.   MRN: [id number] Was admitted to the unit under IVC by wife.  Patient reported that he and wife go into an argument at home and he made some statements that were perceived as suicidal by his wife.  Patient then left the house and didn't answer the phone when his wife called him.  Patient was brought to the hospital by gpd for evaluation.  Patient currently denies SI, HI and AVH.  Skin assessment complete and patient found to be free of all injury and contraband. Patient oriented to the unit without incident.

## 2017-09-16 NOTE — Tx Team (Signed)
Initial Treatment Plan 09/16/2017 6:55 PM Derrick Webb ZOX:[id number]    PATIENT STRESSORS: Marital or family conflict   PATIENT STRENGTHS: General fund of knowledge   PATIENT IDENTIFIED PROBLEMS: I made some statements that I was not going to act on I was just mad   I need to work on my anger.                    DISCHARGE CRITERIA:  Reduction of life-threatening or endangering symptoms to within safe limits  PRELIMINARY DISCHARGE PLAN: Return to previous living arrangement  PATIENT/FAMILY INVOLVEMENT: This treatment plan has been presented to and reviewed with the patient, Derrick LeiterDerek A Dea, and/or family member, The patient and family have been given the opportunity to ask questions and make suggestions.  Jerrye BushyLaRonica R Eliah Ozawa, RN 09/16/2017, 6:55 PM

## 2017-09-16 NOTE — ED Provider Notes (Signed)
[facility]-EMERGENCY DEPT Provider Note   CSN: [id number] Arrival date & time: 09/15/17  2316     History   Chief Complaint Chief Complaint  Patient presents with  . Suicidal    HPI Derrick Webb is a 47 y.o. male.  The history is provided by the patient and medical records.     47 year old male with history of sleep apnea, tobacco abuse, presenting to the ED under IVC petition by his wife.  Patient reports there has been some strain in his marriage for the past 2 years or so.  States he and his wife are merely "coexisting" at this point--they are living in the same house, however sleeping separately and have very limited communication.  They do have a 496-year-old daughter together and he feels they are most likely still together just for her sake.  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  States his wife received a certified letter and assumed that he was "hiding money".  He tried to explain the situation but it got somewhat he did and he made some statements that he realizes were stupid.  He admitted that he was feeling suicidal but states he would never do anything to hurt himself because of his daughter.  He has not tried to hurt himself in any way.  He denies any homicidal ideation.  No hallucinations.  Reports occasional alcohol use but not in excess.  No illicit drug use.  States several years ago he was put on some medication for anxiety, he only took this for a few months or so as this was stress-induced and once he changed jobs those symptoms resolved.  He has never taken any medications for depression.  Past Medical History:  Diagnosis Date  . Palpitations 10/19/2016  . Shortness of breath 10/19/2016  . Sleep apnea 10/19/2016  . Tobacco abuse 10/19/2016    Patient Active Problem List   Diagnosis  Date Noted  . Tobacco abuse 10/19/2016  . Sleep apnea 10/19/2016  . Palpitations 10/19/2016  . Shortness of breath 10/19/2016    History reviewed. No pertinent surgical history.     Home Medications    Prior to Admission medications   Not on File    Family History Family History  Problem Relation Age of Onset  . Dementia Mother   . Hypertension Father   . Dementia Maternal Grandmother   . Sleep apnea Brother     Social History Social History   Tobacco Use  . Smoking status: Current Every Day Smoker  . Smokeless tobacco: Never Used  Substance Use Topics  . Alcohol use: Yes    Comment: occ  . Drug use: No     Allergies   Patient has no known allergies.   Review of Systems Review of Systems  Psychiatric/Behavioral:       IVC  All other systems reviewed and are negative.    Physical Exam Updated Vital Signs BP (!) 143/91 (BP Location: Right Arm)   Pulse 82   Temp 98.4 F (36.9 C) (Oral)   Resp 16   Ht 5\' 8"  (1.727 m)   Wt 91.2 kg (201 lb)   SpO2 97%   BMI 30.56 kg/m   Physical Exam  Constitutional: He is oriented to person, place, and time. He appears well-developed and  well-nourished.  HENT:  Head: Normocephalic and atraumatic.  Mouth/Throat: Oropharynx is clear and moist.  Eyes: Conjunctivae and EOM are normal. Pupils are equal, round, and reactive to light.  Neck: Normal range of motion.  Cardiovascular: Normal rate, regular rhythm and normal heart sounds.  Pulmonary/Chest: Effort normal and breath sounds normal. No stridor. No respiratory distress.  Abdominal: Soft. Bowel sounds are normal. There is no tenderness. There is no rebound.  Musculoskeletal: Normal range of motion.  Neurological: He is alert and oriented to person, place, and time.  Skin: Skin is warm and dry.  Psychiatric: He has a normal mood and affect.  Calm, cooperative No active SI/HI/AVH endorsed during exam, reports passive thoughts  Nursing note and vitals  reviewed.    ED Treatments / Results  Labs (all labs ordered are listed, but only abnormal results are displayed) Labs Reviewed  COMPREHENSIVE METABOLIC PANEL - Abnormal; Notable for the following components:      Result Value   Glucose, Bld 116 (*)    AST 13 (*)    ALT 15 (*)    All other components within normal limits  ACETAMINOPHEN LEVEL - Abnormal; Notable for the following components:   Acetaminophen (Tylenol), Serum <10 (*)    All other components within normal limits  ETHANOL  SALICYLATE LEVEL  CBC  RAPID URINE DRUG SCREEN, HOSP PERFORMED    EKG  EKG Interpretation None       Radiology No results found.  Procedures Procedures (including critical care time)  Medications Ordered in ED Medications - No data to display   Initial Impression / Assessment and Plan / ED Course  I have reviewed the triage vital signs and the nursing notes.  Pertinent labs & imaging results that were available during my care of the patient were reviewed by me and considered in my medical decision making (see chart for details).  47 year old male here under IVC petition by his wife.  Reportedly he has been expressing some suicidal thoughts at home.  He has been under a great deal of stress due to finances, marriage, etc.  Denies any active suicidal or homicidal ideation to me.  No hallucinations.  No excessive alcohol or illicit drug use.  Patient has no physical complaints at this time.  Screening labs overall reassuring.  Patient medically cleared.  TTS has evaluated patient, recommendation is for inpatient treatment.  No beds available at this time, will notify us when one opens up.  Final Clinical Impressions(s) / ED Diagnoses   Final diagnoses:  Involuntary commitment    ED Discharge Orders    None       Garlon Hatchet, PA-C 09/16/17 0451    Ward, Layla Maw, DO 09/16/17 [phone]

## 2017-09-16 NOTE — ED Notes (Signed)
Sheriff on unit [facility]. Pt signed for personal property and property given to sheriff for transfer. Pt ambulatory off unit in sheriff custody.

## 2017-09-17 NOTE — BHH Counselor (Signed)
Adult Comprehensive Assessment  Patient ID: Derrick Webb, male   DOB: 1971/05/17, 47 y.o.   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 Family Relationships: Conflict within marriage for the past year or so, intermittent. Financial / Lack of resources (include bankruptcy): Denies stressors. Housing / Lack of housing: Denies stressors. Physical health (include injuries & life threatening diseases): Denies stressors. Social relationships: Denies stressors. Substance abuse: Denies stressors. Bereavement / Loss: Denies stressors.  Living/Environment/Situation:  Living Arrangements: Spouse/significant other, Children(wife, 7yo daughter) Living conditions (as described by patient or guardian): Good How long has patient lived in current situation?: 8 years What is atmosphere in current home: Comfortable, Other (Comment)(Some conflict)  Family History:  Marital status: Married Number of Years Married: 8 What types of issues is patient dealing with in the relationship?: Money, trust issues, compatibility Are you sexually active?: Yes What is your sexual orientation?: Heterosexual Does patient have children?: Yes How many children?: 1 How is patient's relationship with their children?: 7yo daughter - excellent relationship  Childhood History:  By whom was/is the patient raised?: Both parents Description of patient's relationship with caregiver when they were a child: [place] Patient's description of current relationship with people who raised him/her: Still a great relationship with both parents - they live about 2 hours away. How were you disciplined when you got in trouble as a child/adolescent?: "Old school way" - spanking Does patient have siblings?: Yes Number of Siblings:  1 Description of patient's current relationship with siblings: Older brother - good relationship Did patient suffer any verbal/emotional/physical/sexual abuse as a child?: No Did patient suffer from severe childhood neglect?: No Has patient ever been sexually abused/assaulted/raped as an adolescent or adult?: No Was the patient ever a victim of a crime or a disaster?: No Witnessed domestic violence?: Yes Has patient been effected by domestic violence as an adult?: No Description of domestic violence: No violence in the home, but witnessed in the neighborhood.  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 Has patient ever been in the Eli Lilly and Companymilitary?: No Are There Guns or Other Weapons in Your Home?: Yes Types of Guns/Weapons: handgun Are These Weapons Safely Secured?: Yes  Financial Resources:   Financial resources: Income from employment, Private insurance(BCBS) Does patient have a Lawyerrepresentative payee or guardian?: No  Alcohol/Substance Abuse:   What has been your use of drugs/alcohol within the last 12 months?: Social drinking every 2-3 days Alcohol/Substance Abuse Treatment Hx: Denies past history Has alcohol/substance abuse ever caused legal problems?: No  Social Support System:   Conservation officer, natureatient's Community Support System: Good Describe Community Support System: Wife, parents Type of faith/religion: Christianity How does patient's faith help to cope with current illness?: Keeps him focused on solving problems  Leisure/Recreation:    Leisure and Hobbies: Ride motorcycles, walking.  Strengths/Needs:   What things does the patient do well?: Good communicator, people person In what areas  does patient struggle / problems for patient: Marriage  Discharge Plan:   Does patient have access to transportation?: Yes Will patient be returning to same living situation after discharge?: Yes Currently receiving community mental health services: No If no, would patient like referral for services when discharged?: Yes (What county?)(Interested in marriage counseling, [place], [place]) Does patient have financial barriers related to discharge medications?: No  Summary/Recommendations:   Summary and Recommendations (to be completed by the evaluator): Patient is a 47yo male admitted under IVC due to arguing with wife and threatening repeatedly to commit suicide by shooting himself or having an accident with his car.  The gun has been removed from the home.   Patient denies the content of the Involuntary Commitment.  Primary stressors include marital strain for the last year and job-related pressures.  He drinks alcohol socially and denies problems with alcohol or drugs.  Patient will benefit from crisis stabilization, medication evaluation, group therapy and psychoeducation, in addition to case management for discharge planning. At discharge it is recommended that Patient adhere to the established discharge plan and continue in treatment.  Lynnell Chad. 09/17/2017

## 2017-09-17 NOTE — H&P (Signed)
Psychiatric Admission Assessment Adult  Patient Identification: Derrick Webb MRN:  [id number] Date of Evaluation:  09/17/2017 Chief Complaint:  Depression with suicidal thoughts. Principal Diagnosis: MDD Diagnosis:   Patient Active Problem List   Diagnosis Date Noted  . Generalized anxiety disorder [F41.1] 09/16/2017  . Major depressive disorder, single episode, severe ([place]) [F32.2] 09/16/2017  . MDD (major depressive disorder), single episode, severe , no psychosis ([place]) [F32.2] 09/16/2017  . Tobacco abuse [Z72.0] 10/19/2016  . Sleep apnea [G47.30] 10/19/2016  . Palpitations [R00.2] 10/19/2016  . Shortness of breath [R06.02] 10/19/2016   History of Present Illness: 47 y.o AAM, married, lives with his family, employed. No past history of mental illness. No substance use. Presented to the ER involuntarily. Was reported to have gotten into an argument with his wife. Expressed suicidal thoughts at home. Entered his car and drove off. His wife initiated IVC as patient had been threatening to shoot self or crash his car. Reports long standing strained relationship with his wife. Their 25 year old daughter has been protective. Recent stressor as marital and financial concerns. Routine labs are within normal limits. No alcohol or illicit substance. His wife has secured his gun.  At interview, patient reports that he has been functioning well at work and at home. 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 Says they have been married for nine years. In the past couple of years they have been having a lot of marital strain. Says after an argument he told his wife "if anything happen's to me make sure Dess is taken care of". Patient then went into his car and zoomed off. Says he had no intention to harm self. Says he has used that a a negotiating chip in the past. Repeatedly tells me that he has never had the intention. Says he cannot conceive doing such a thing to his daughter. Says his daughter's birthday  is tomorrow "she is my world". Patient says he has not been feeling depressed. He has periods of sadness. Says he is able to shake it off after a few minutes. He has normal appetite, normal sleep and normal energy. Says he has not been ruminating on any issue. Says he has been considering family therapy or divorce. Says he can understand why his wife felt alarmed. No associated psychosis. No evidence of mania. No overwhelming anxiety. No evidence of PTSD. No substance use. No thoughts of harming others. No thoughts of violence.    Total Time spent with patient: 1 hour  Past Psychiatric History: No past history of depression. Has had panic attack in the past.  No past history of mania. No past history of psychosis. No past history of suicidal behavior. No past history of violent behavior. He has never been on any psychotropic medication in the past. No past out or inpatient treatment. No past psychological treatment. No past history of substance abuse.    Is the patient at risk to self? No.  Has the patient been a risk to self in the past 6 months? No.  Has the patient been a risk to self within the distant past? No.  Is the patient a risk to others? No.  Has the patient been a risk to others in the past 6 months? No.  Has the patient been a risk to others within the distant past? No.   Prior Inpatient Therapy:   Prior Outpatient Therapy:    Alcohol Screening: 1. How often do you have a drink containing alcohol?: 4 or  more times a week 2. How many drinks containing alcohol do you have on a typical day when you are drinking?: 1 or 2 3. How often do you have six or more drinks on one occasion?: Never AUDIT-C Score: 4 5. How often during the last year have you failed to do what was normally expected from you becasue of drinking?: Never 6. How often during the last year have you needed a first drink in the morning to get yourself going after a heavy drinking session?: Never 7. How often during the  last year have you had a feeling of guilt of remorse after drinking?: Never 8. How often during the last year have you been unable to remember what happened the night before because you had been drinking?: Never 9. Have you or someone else been injured as a result of your drinking?: No 10. Has a relative or friend or a doctor or another health worker been concerned about your drinking or suggested you cut down?: No Alcohol Use Disorder Identification Test Final Score (AUDIT): 4 Intervention/Follow-up: AUDIT Score <7 follow-up not indicated Substance Abuse History in the last 12 months:  No. Consequences of Substance Abuse: Negative Previous Psychotropic Medications: No  Psychological Evaluations: No  Past Medical History:  Past Medical History:  Diagnosis Date  . Palpitations 10/19/2016  . Shortness of breath 10/19/2016  . Sleep apnea 10/19/2016  . Tobacco abuse 10/19/2016   History reviewed. No pertinent surgical history. Family History:  Family History  Problem Relation Age of Onset  . Dementia Mother   . Hypertension Father   . Dementia Maternal Grandmother   . Sleep apnea Brother    Family Psychiatric  History: Denies any family history of mental illness, substance use disorder or suicide.  Tobacco Screening: Have you used any form of tobacco in the last 30 days? (Cigarettes, Smokeless Tobacco, Cigars, and/or Pipes): Yes Tobacco use, Select all that apply: cigar use daily Are you interested in Tobacco Cessation Medications?: No, patient refused Counseled patient on smoking cessation including recognizing danger situations, developing coping skills and basic information about quitting provided: Refused/Declined practical counseling Social History:  Social History   Substance and Sexual Activity  Alcohol Use Yes   Comment: occ     Social History   Substance and Sexual Activity  Drug Use No    Additional Social History:                           Allergies:  No  Known Allergies Lab Results:  Results for orders placed or performed during the hospital encounter of 09/16/17 (from the past 48 hour(s))  Comprehensive metabolic panel     Status: Abnormal   Collection Time: 09/16/17  1:26 AM  Result Value Ref Range   Sodium 139 135 - 145 mmol/L   Potassium 4.3 3.5 - 5.1 mmol/L   Chloride 106 101 - 111 mmol/L   CO2 26 22 - 32 mmol/L   Glucose, Bld 116 (H) 65 - 99 mg/dL   BUN 12 6 - 20 mg/dL   Creatinine, Ser 1.23 0.61 - 1.24 mg/dL   Calcium 9.0 8.9 - 10.3 mg/dL   Total Protein 7.5 6.5 - 8.1 g/dL   Albumin 4.6 3.5 - 5.0 g/dL   AST 13 (L) 15 - 41 U/L   ALT 15 (L) 17 - 63 U/L   Alkaline Phosphatase 48 38 - 126 U/L   Total Bilirubin 0.5 0.3 - 1.2 mg/dL  GFR calc non Af Amer >60 >60 mL/min   GFR calc Af Amer >60 >60 mL/min    Comment: (NOTE) The eGFR has been calculated using the CKD EPI equation. This calculation has not been validated in all clinical situations. eGFR's persistently <60 mL/min signify possible Chronic Kidney Disease.    Anion gap 7 5 - 15    Comment: Performed at [facility], [place] [address], [place], [place] [address]  Ethanol     Status: None   Collection Time: 09/16/17  1:26 AM  Result Value Ref Range   Alcohol, Ethyl (B) <10 <10 mg/dL    Comment:        LOWEST DETECTABLE LIMIT FOR SERUM ALCOHOL IS 10 mg/dL FOR MEDICAL PURPOSES ONLY Performed at [facility] [address], [place], [place] [address]   Salicylate level     Status: None   Collection Time: 09/16/17  1:26 AM  Result Value Ref Range   Salicylate Lvl <9.9 2.8 - 30.0 mg/dL    Comment: Performed at [facility], [place] [address], [place], [place] [address]  Acetaminophen level     Status: Abnormal   Collection Time: 09/16/17  1:26 AM  Result Value Ref Range   Acetaminophen (Tylenol), Serum <10 (L) 10 - 30 ug/mL    Comment:        THERAPEUTIC CONCENTRATIONS VARY SIGNIFICANTLY. A RANGE OF 10-30 ug/mL  MAY BE AN EFFECTIVE CONCENTRATION FOR MANY PATIENTS. HOWEVER, SOME ARE BEST TREATED AT CONCENTRATIONS OUTSIDE THIS RANGE. ACETAMINOPHEN CONCENTRATIONS >150 ug/mL AT 4 HOURS AFTER INGESTION AND >50 ug/mL AT 12 HOURS AFTER INGESTION ARE OFTEN ASSOCIATED WITH TOXIC REACTIONS. Performed at [facility], [place] [address] [place], [place] [address]   cbc     Status: None   Collection Time: 09/16/17  1:26 AM  Result Value Ref Range   WBC 5.5 4.0 - 10.5 K/uL   RBC 4.70 4.22 - 5.81 MIL/uL   Hemoglobin 13.7 13.0 - 17.0 g/dL   HCT 42.4 39.0 - 52.0 %   MCV 90.2 78.0 - 100.0 fL   MCH 29.1 26.0 - 34.0 pg   MCHC 32.3 30.0 - 36.0 g/dL   RDW 12.5 11.5 - 15.5 %   Platelets 230 150 - 400 K/uL    Comment: Performed at [facility], [place] [address], [place], [place] [address]  Rapid urine drug screen (hospital performed)     Status: None   Collection Time: 09/16/17  2:36 AM  Result Value Ref Range   Opiates NONE DETECTED NONE DETECTED   Cocaine NONE DETECTED NONE DETECTED   Benzodiazepines NONE DETECTED NONE DETECTED   Amphetamines NONE DETECTED NONE DETECTED   Tetrahydrocannabinol NONE DETECTED NONE DETECTED   Barbiturates NONE DETECTED NONE DETECTED    Comment: (NOTE) DRUG SCREEN FOR MEDICAL PURPOSES ONLY.  IF CONFIRMATION IS NEEDED FOR ANY PURPOSE, NOTIFY LAB WITHIN 5 DAYS. LOWEST DETECTABLE LIMITS FOR URINE DRUG SCREEN Drug Class                     Cutoff (ng/mL) Amphetamine and metabolites    1000 Barbiturate and metabolites    200 Benzodiazepine                 510 Tricyclics and metabolites     300 Opiates and metabolites        300 Cocaine and metabolites        300 THC  50 Performed at [facility], [place] [address], [place], [place] [address]     Blood Alcohol level:  Lab Results  Component Value Date   ETH <10 29/56/2130    Metabolic Disorder Labs:  No results found for: HGBA1C, MPG No  results found for: PROLACTIN No results found for: CHOL, TRIG, HDL, CHOLHDL, VLDL, LDLCALC  Current Medications: Current Facility-Administered Medications  Medication Dose Route Frequency Provider Last Rate Last Dose  . acetaminophen (TYLENOL) tablet 650 mg  650 mg Oral Q6H PRN Ethelene Hal, NP      . alum & mag hydroxide-simeth (MAALOX/MYLANTA) 200-200-20 MG/5ML suspension 30 mL  30 mL Oral Q4H PRN Ethelene Hal, NP      . citalopram (CELEXA) tablet 10 mg  10 mg Oral Daily Ethelene Hal, NP   Stopped at 09/17/17 8657  . hydrOXYzine (ATARAX/VISTARIL) tablet 25 mg  25 mg Oral TID PRN Ethelene Hal, NP      . hydrOXYzine (ATARAX/VISTARIL) tablet 25 mg  25 mg Oral QHS Ethelene Hal, NP      . magnesium hydroxide (MILK OF MAGNESIA) suspension 30 mL  30 mL Oral Daily PRN Ethelene Hal, NP      . traZODone (DESYREL) tablet 50 mg  50 mg Oral QHS PRN Ethelene Hal, NP       PTA Medications: No medications prior to admission.    Musculoskeletal: Strength & Muscle Tone: within normal limits Gait & Station: normal Patient leans: N/A  Psychiatric Specialty Exam: Physical Exam  Constitutional: He is oriented to person, place, and time. He appears well-developed and well-nourished.  HENT:  Head: Normocephalic and atraumatic.  Respiratory: Effort normal.  Neurological: He is alert and oriented to person, place, and time.  Psychiatric:  As above    ROS  Blood pressure (!) 133/97, pulse 97, temperature 97.9 F (36.6 C), temperature source Oral, resp. rate 20, height '5\' 8"'$  (1.727 m), weight 91.2 kg (201 lb), SpO2 100 %.Body mass index is 30.56 kg/m.  General Appearance: Neatly dressed, calm and cooperative. Appropriate behavior. Good relatedness.   Eye Contact:  Good  Speech:  Clear and Coherent and Normal Rate  Volume:  Normal  Mood:  Worried about how long he would be here  Affect:  Appropriate and Congruent  Thought Process:  Linear   Orientation:  Full (Time, Place, and Person)  Thought Content:  Ruminations about his marriage. No delusional theme. No preoccupation with violent thoughts. No obsession.  No hallucination in any modality.   Suicidal Thoughts:  No  Homicidal Thoughts:  No  Memory:  Immediate;   Good Recent;   Good Remote;   Good  Judgement:  Fair  Insight:  Good  Psychomotor Activity:  Normal  Concentration:  Concentration: Good and Attention Span: Good  Recall:  Good  Fund of Knowledge:  Good  Language:  Good  Akathisia:  Negative  Handed:    AIMS (if indicated):     Assets:  Communication Skills Desire for Improvement Financial Resources/Insurance Housing Physical Health Resilience Talents/Skills Transportation Vocational/Educational  ADL's:  Impaired  Cognition:  WNL  Sleep:  Number of Hours: 6.75    Treatment Plan Summary: Patient presented involuntarily after raging at home. He had expressed suicidal thoughts. It was precipitated by an emotionally charged argument with his wife. Patient is dismissive of any intent to harm self or others. He was started on Citalopram at the ER. He refused it this morning as it made  him jittery. Patient does not feel he needs medications. We plan to gather more information and evaluate him further.   Psychiatric: ?MDD Adjustment Disorder  Medical:  Psychosocial:  Relationship issues    PLAN: 1. Hydroxyzine PRN for anxiety 2. Encourage unit groups and therapeutic activities 3. Monitor mood, behavior and interaction with peers 4. SW would gather collateral from his family and coordinate aftercare   Observation Level/Precautions:  15 minute checks  Laboratory:    Psychotherapy:    Medications:    Consultations:    Discharge Concerns:    Estimated LOS:  Other:     Physician Treatment Plan for Primary Diagnosis: <principal problem not specified> Long Term Goal(s): Improvement in symptoms so as ready for discharge  Short Term Goals:  Ability to identify changes in lifestyle to reduce recurrence of condition will improve, Ability to verbalize feelings will improve, Ability to disclose and discuss suicidal ideas, Ability to demonstrate self-control will improve, Ability to identify and develop effective coping behaviors will improve, Ability to maintain clinical measurements within normal limits will improve and Compliance with prescribed medications will improve  Physician Treatment Plan for Secondary Diagnosis: Active Problems:   MDD (major depressive disorder), single episode, severe , no psychosis ([place])  Long Term Goal(s): Improvement in symptoms so as ready for discharge  Short Term Goals: Ability to identify changes in lifestyle to reduce recurrence of condition will improve, Ability to verbalize feelings will improve, Ability to disclose and discuss suicidal ideas, Ability to demonstrate self-control will improve, Ability to identify and develop effective coping behaviors will improve, Ability to maintain clinical measurements within normal limits will improve and Compliance with prescribed medications will improve  I certify that inpatient services furnished can reasonably be expected to improve the patient's condition.    [place], MD 2/17/20192:02 PM

## 2017-09-17 NOTE — [facility] Suicide Risk Assessment (Signed)
[facility]   Nursing information obtained from:  Patient Demographic factors:  Male Current Mental Status:  NA Loss Factors:  Loss of significant relationship Historical Factors:  NA Risk Reduction Factors:  Responsible for children under 46 years of age  Total Time spent with patient: 45 minutes Principal Problem: MDD (major depressive disorder), single episode, severe , no psychosis ([facility]) Diagnosis:   Patient Active Problem List   Diagnosis Date Noted  . Generalized anxiety disorder [F41.1] 09/16/2017  . Major depressive disorder, single episode, severe ([facility]) [F32.2] 09/16/2017  . MDD (major depressive disorder), single episode, severe , no psychosis ([facility]) [F32.2] 09/16/2017  . Tobacco abuse [Z72.0] 10/19/2016  . Sleep apnea [G47.30] 10/19/2016  . Palpitations [R00.2] 10/19/2016  . Shortness of breath [R06.02] 10/19/2016   Subjective Data:  47 y.o AAM, married, lives with his family, employed. No past history of mental illness. No substance use. Presented to the ER involuntarily. Was reported to have gotten into an argument with his wife. Expressed suicidal thoughts at home. Entered his car and drove off. His wife initiated IVC as patient had been threatening to shoot self or crash his car. Reports long standing strained relationship with his wife. Their 26seven year old daughter has been protective. Recent stressor as marital and financial concerns. Routine labs are within normal limits. No alcohol or illicit substance. His wife has secured his gun. He is not pervasively depressed. No past suicidal behavior, no family history of suicide, no evidence of psychosis. No evidence of mania. No cognitive impairment. No access to weapons. He is cooperative with care. He has agreed to treatment recommendations. He has agreed to communicate suicidal thoughts to staff if the thoughts becomes overwhelming.      Continued Clinical Symptoms:  Alcohol Use Disorder  Identification Test Final Score (AUDIT): 4 The "Alcohol Use Disorders Identification Test", Guidelines for Use in Primary Care, Second Edition.  World Science writerHealth Organization [facility](WHO). Score between 0-7:  no or low risk or alcohol related problems. Score between 8-15:  moderate risk of alcohol related problems. Score between 16-19:  high risk of alcohol related problems. Score 20 or above:  warrants further diagnostic evaluation for alcohol dependence and treatment.   CLINICAL FACTORS:   Depression:   Impulsivity Adjustment Disorder  Musculoskeletal: Strength & Muscle Tone: within normal limits Gait & Station: normal Patient leans: N/A  Psychiatric Specialty Exam: Physical Exam  ROS  Blood pressure (!) 133/97, pulse 97, temperature 97.9 F (36.6 C), temperature source Oral, resp. rate 20, height 5\' 8"  (1.727 m), weight 91.2 kg (201 lb), SpO2 100 %.Body mass index is 30.56 kg/m.  General Appearance: As in H&P  Eye Contact:    Speech:    Volume:    Mood:    Affect:    Thought Process:    Orientation:    Thought Content:    Suicidal Thoughts:    Homicidal Thoughts:  As in H&P  Memory:    Judgement:    Insight:    Psychomotor Activity:    Concentration:    Recall:    Fund of Knowledge:    Language:    Akathisia:    Handed:    AIMS (if indicated):     Assets:    ADL's:    Cognition:  As in H&P  Sleep:  Number of Hours: 6.75      COGNITIVE FEATURES THAT CONTRIBUTE TO RISK:  None    SUICIDE RISK:   Minimal: No identifiable suicidal ideation.  Patients presenting with no risk factors but with morbid ruminations; may be classified as minimal risk based on the severity of the depressive symptoms  PLAN OF CARE:  As in H&P  I certify that inpatient services furnished can reasonably be expected to improve the patient's condition.   Georgiann Cocker, MD 09/17/2017, 2:45 PM

## 2017-09-17 NOTE — Progress Notes (Signed)
Adult Psychoeducational Group Note  Date:  09/17/2017 Time:  10:19 PM  Group Topic/Focus:  Wrap-Up Group:   The focus of this group is to help patients review their daily goal of treatment and discuss progress on daily workbooks.  Participation Level:  Active  Participation Quality:  Appropriate, Attentive and Sharing  Affect:  Appropriate  Cognitive:  Alert, Appropriate and Oriented  Insight: Good  Engagement in Group:  Engaged and Supportive  Modes of Intervention:  Discussion  Additional Comments:  Pt stated his goal for the day was to stay positive, which he achieved. He was able to speak with someone to come up with a plan, which the pt stated made him happy. Pt rated his day a 10/10 and said he hopes everything is the same tomorrow.   Leo GrosserMegan A Audric Venn 09/17/2017, 10:19 PM

## 2017-09-17 NOTE — B[facility] Group Notes (Signed)
B[facility] Group Notes:  (MHT Orientation)  Date:  09/17/2017  Time:1000 am Type of Therapy:  Orientation  Participation Level:  Active  Participation Quality:  Appropriate  Affect:  Appropriate  Cognitive:  Alert and Appropriate  Insight:  Appropriate  Engagement in Group:  Engaged  Modes of Intervention:  Discussion  Summary of Progress/Problems: Patient engaged appropriately in  orientation group led by MHT Shela NevinValerie S Javeria Briski 09/17/2017, 1:59 PM

## 2017-09-17 NOTE — Progress Notes (Signed)
D. Pt presents with a flat affect and calm, appropriate behavior.  Pt reports being concerned about his antidepressant 'raising his B/P' and would like to hold taking it until speaking with the doctor. Per pt's self inventory, pt rates his depression, hopelessness and anxiety a 0/0/1, respectively. Pt writes that his goal today is to "focus on not letting negatives affect me", and "relax and remain calm". Pt currently denies SI/HI and AVH  A. Labs and vitals monitored. Pt supported emotionally and encouraged to express concerns and ask questions.   R. Pt remains safe with 15 minute checks. Will continue POC.

## 2017-09-17 NOTE — [facility] Group Notes (Signed)
[facility]  Date/Time:  09/17/2017 10:00-11:00AM  Type of Therapy and Topic:  Group Therapy:  Healthy and Unhealthy Supports  Participation Level:  Active   Description of Group:  Patients in this group were introduced to the idea of adding a variety of healthy supports to address the various needs in their lives.Patients discussed what additional healthy supports could be helpful in their recovery and wellness after discharge in order to prevent future hospitalizations.   An emphasis was placed on using counselor, doctor, therapy groups, 12-step groups, and problem-specific support groups to expand supports.  They also worked as a group on developing a specific plan for several patients to deal with unhealthy supports through boundary-setting, psychoeducation with loved ones, and even termination of relationships.   Therapeutic Goals:   1)  discuss importance of adding supports to stay well once out of the hospital  2)  compare healthy versus unhealthy supports and identify some examples of each  3)  generate ideas and descriptions of healthy supports that can be added  4)  offer mutual support about how to address unhealthy supports  5)  encourage active participation in and adherence to discharge plan    Summary of Patient Progress:  The patient expressed a willingness to add a doctor and therapist to help in his recovery journey.  He spoke a little during group, but was very attentive throughout.   Therapeutic Modalities:   Motivational Interviewing Brief Solution-Focused Therapy  Ambrose MantleMareida Grossman-Orr, LCSW

## 2017-09-18 DIAGNOSIS — G4700 Insomnia, unspecified: Secondary | ICD-10-CM

## 2017-09-18 DIAGNOSIS — F17210 Nicotine dependence, cigarettes, uncomplicated: Secondary | ICD-10-CM

## 2017-09-18 DIAGNOSIS — F322 Major depressive disorder, single episode, severe without psychotic features: Principal | ICD-10-CM

## 2017-09-18 DIAGNOSIS — Z810 Family history of intellectual disabilities: Secondary | ICD-10-CM

## 2017-09-18 NOTE — Progress Notes (Signed)
Adult Psychoeducational Group Note  Date:  09/18/2017 Time:  1:52 PM  Group Topic/Focus:  Wellness Toolbox:   The focus of this group is to discuss various aspects of wellness, balancing those aspects and exploring ways to increase the ability to experience wellness.  Patients will create a wellness toolbox for use upon discharge.  Participation Level:  Active  Participation Quality:  Appropriate  Affect:  Appropriate  Cognitive:  Alert  Insight: Appropriate and Good  Engagement in Group:  Engaged and Improving  Modes of Intervention:  Activity and Discussion  Additional Comments:  Pt did participate in all group activities and discussions today.  Atziry Baranski R Chelby Salata 09/18/2017, 1:52 PM

## 2017-09-18 NOTE — [facility] Group Notes (Signed)
[facility] LCSW Group Therapy Note  Date/Time: 09/18/17, 1315  Type of Therapy and Topic:  Group Therapy:  Overcoming Obstacles  Participation Level:  minimal  Description of Group:    In this group patients will be encouraged to explore what they see as obstacles to their own wellness and recovery. They will be guided to discuss their thoughts, feelings, and behaviors related to these obstacles. The group will process together ways to cope with barriers, with attention given to specific choices patients can make. Each patient will be challenged to identify changes they are motivated to make in order to overcome their obstacles. This group will be process-oriented, with patients participating in exploration of their own experiences as well as giving and receiving support and challenge from other group members.  Therapeutic Goals: 1. Patient will identify personal and current obstacles as they relate to admission. 2. Patient will identify barriers that currently interfere with their wellness or overcoming obstacles.  3. Patient will identify feelings, thought process and behaviors related to these barriers. 4. Patient will identify two changes they are willing to make to overcome these obstacles:    Summary of Patient Progress: pt identified communication and marital issues as the main obstacles in his life.  Pt did not participate a lot in group discussion but was attentive and did respond to CSW questions during group.      Therapeutic Modalities:   Cognitive Behavioral Therapy Solution Focused Therapy Motivational Interviewing Relapse Prevention Therapy  Daleen SquibbGreg Darcee Dekker, LCSW

## 2017-09-18 NOTE — [facility] Group Notes (Signed)
Group was facilitated and Clinical research associatewriter discussed rules and regulations of the unit, explained the schedule for the rest of the evening(phone times, med times, vitals and breakfast in the am.) Writer ask each patient how their day was on a scale of 1-10. Pt stated his day was a 10. Pt stated he talked with the MD and SW and possible D/C tomorrow. Writer spoke with patients about taking care of themselves and replacing negative thoughts with positive thoughts. Writer encourage patients to write down their accomplishments and celebrate them as they complete each one no matter how big or small. Writer spoke about setting boundaries and not feeling guilty for doing what is best for them.

## 2017-09-18 NOTE — [facility] Suicide Risk Assessment (Signed)
[facility] INPATIENT:  Family/Significant Other Suicide Prevention Education  Suicide Prevention Education:  Education Completed; Derrick Webb, wife, [phone], has been identified by the patient as the family member/significant other with whom the patient will be residing, and identified as the person(s) who will aid the patient in the event of a mental health crisis (suicidal ideations/suicide attempt).  With written consent from the patient, the family member/significant other has been provided the following suicide prevention education, prior to the and/or following the discharge of the patient.  The suicide prevention education provided includes the following:  Suicide risk factors  Suicide prevention and interventions  National Suicide Hotline telephone number  [facility] assessment telephone number  [facility] Emergency Assistance 911  [facility] and/or [facility]idential [facility] Crisis Unit telephone number  Request made of family/significant other to:  Remove weapons (e.g., guns, rifles, knives), all items previously/currently identified as safety concern.  Derrick Webb reports she has removed pt handgun from the home and pt does not have access to it.  Remove drugs/medications (over-the-counter, prescriptions, illicit drugs), all items previously/currently identified as a safety concern.  The family member/significant other verbalizes understanding of the suicide prevention education information provided.  The family member/significant other agrees to remove the items of safety concern listed above.  Derrick Webb reports marital and financial issues are the main stressors.  She is not sure how this will end up but pt also has his father as a support person in any event.  She will continue to support moving forward.  She tried to visit yesterday but showed up at the wrong time.  She is able to arrange to come see pt tonight.  She has spoken to him over the phone more than once and pt does  seem to be doing better.  Lorri FrederickWierda, Lanny Donoso Jon, LCSW 09/18/2017, 10:37 AM

## 2017-09-18 NOTE — Progress Notes (Signed)
Recreation Therapy Notes  Date: 09/17/17 Time: 0930 Location: [address] Dayroom  Group Topic: Stress Management  Goal Area(s) Addresses:  Patient will verbalize importance of using healthy stress management.  Patient will identify positive emotions associated with healthy stress management.   Behavioral Response: Engaged  Intervention: Stress Management  Activity :  Mountain Meditation.  LRT played a meditation on the power of mountains to be resilient in the face of change.  Patients were to follow along with the meditation as it played.  Education:  Stress Management, Discharge Planning.   Education Outcome: Acknowledges edcuation/In group clarification offered/Needs additional education  Clinical Observations/Feedback: Pt attended group.     Almin Livingstone, LRT/CTRS         Kerilyn Cortner A 09/18/2017 11:12 AM 

## 2017-09-18 NOTE — Progress Notes (Signed)
D: Patient is pleasant and is appropriate with staff.  Patient thinks he is under voluntary status; will check with MD today as to correct status.  He rates his depression, hopelessness and anxiety as a 3.  He is sleeping and eating well; his energy level is low and his concentration is poor.  Patient's goal today is to work on "happiness."  He states, "I want clarity and focus.  My biggest issue is my marriage."  Patient has given consent to have social worker speak with wife for collateral information.  A: Continue to monitor medication management and MD orders.  Safety checks completed every 15 minutes per protocol.  Offer support and encouragement as needed.  R: Patient is receptive to staff; his behavior is appropriate.

## 2017-09-18 NOTE — Progress Notes (Signed)
Patient ID: Derrick Webb, male   DOB: 01-25-1971, 47 y.o.   MRN: [id number]  Pt currently presents with an appropriate affect and cooperative behavior. Pt seen in the dayroom, interacting pleasantly with peers. Pt states "I guess I'll be here until Tuesday. I am glad I will be voluntary now." Pt reports good sleep without any sleep aids.   Pt's labs and vitals were monitored throughout the night. Pt given a 1:1 about emotional and mental status. Pt supported and encouraged to express concerns and questions. Pt educated on assertiveness technqiues.   Pt's safety ensured with 15 minute and environmental checks. Pt currently denies SI/HI and A/V hallucinations. Pt verbally agrees to seek staff if SI/HI or A/VH occurs and to consult with staff before acting on any harmful thoughts. Will continue POC.

## 2017-09-18 NOTE — Progress Notes (Addendum)
[facility] MD Progress Note  09/18/2017 2:14 PM Derrick Webb  MRN:  [id number]   Subjective:  Patient reports that he is doing good today. He states that he has talked to his wife everyday, but she has not visited because of his daughter. He reports that we can call her and that he plans to go back there to live after discharge. They plan to do marriage counseling. He continues to state that he was never intending to hurt himself and he has said those comments of self harm in the past to win the argument and to win the "sympathy card" against his wife. He denies any SI/HI/AVH and contracts for safety.  Objective: Patient's chart and findings reviewed and discussed with treatment team. Patient has been attending groups and interacting with peers and staff appropriately. Patient will hopefully be discharged tomorrow after CSW has gained collateral from patient's wife. He still refuses medications.   Principal Problem: MDD (major depressive disorder), single episode, severe , no psychosis ([place]) Diagnosis:   Patient Active Problem List   Diagnosis Date Noted  . Generalized anxiety disorder [F41.1] 09/16/2017  . Major depressive disorder, single episode, severe ([place]) [F32.2] 09/16/2017  . MDD (major depressive disorder), single episode, severe , no psychosis ([place]) [F32.2] 09/16/2017  . Tobacco abuse [Z72.0] 10/19/2016  . Sleep apnea [G47.30] 10/19/2016  . Palpitations [R00.2] 10/19/2016  . Shortness of breath [R06.02] 10/19/2016   Total Time spent with patient: 15 minutes  Past Psychiatric History: See H&P  Past Medical History:  Past Medical History:  Diagnosis Date  . Palpitations 10/19/2016  . Shortness of breath 10/19/2016  . Sleep apnea 10/19/2016  . Tobacco abuse 10/19/2016   History reviewed. No pertinent surgical history. Family History:  Family History  Problem Relation Age of Onset  . Dementia Mother   . Hypertension Father   . Dementia Maternal Grandmother   . Sleep apnea Brother     Family Psychiatric  History: See H&P Social History:  Social History   Substance and Sexual Activity  Alcohol Use Yes   Comment: occ     Social History   Substance and Sexual Activity  Drug Use No    Social History   Socioeconomic History  . Marital status: Single    Spouse name: None  . Number of children: None  . Years of education: None  . Highest education level: None  Social Needs  . Financial resource strain: None  . Food insecurity - worry: None  . Food insecurity - inability: None  . Transportation needs - medical: None  . Transportation needs - non-medical: None  Occupational History  . None  Tobacco Use  . Smoking status: Current Every Day Smoker  . Smokeless tobacco: Never Used  Substance and Sexual Activity  . Alcohol use: Yes    Comment: occ  . Drug use: No  . Sexual activity: None  Other Topics Concern  . None  Social History Narrative  . None   Additional Social History:                         Sleep: Good  Appetite:  Good  Current Medications: Current Facility-Administered Medications  Medication Dose Route Frequency Provider Last Rate Last Dose  . acetaminophen (TYLENOL) tablet 650 mg  650 mg Oral Q6H PRN Ethelene Hal, NP      . alum & mag hydroxide-simeth (MAALOX/MYLANTA) 200-200-20 MG/5ML suspension 30 mL  30 mL Oral Q4H PRN  Ethelene Hal, NP      . hydrOXYzine (ATARAX/VISTARIL) tablet 25 mg  25 mg Oral TID PRN Ethelene Hal, NP      . hydrOXYzine (ATARAX/VISTARIL) tablet 25 mg  25 mg Oral QHS Ethelene Hal, NP      . magnesium hydroxide (MILK OF MAGNESIA) suspension 30 mL  30 mL Oral Daily PRN Ethelene Hal, NP      . traZODone (DESYREL) tablet 50 mg  50 mg Oral QHS PRN Ethelene Hal, NP        Lab Results: No results found for this or any previous visit (from the past 48 hour(s)).  Blood Alcohol level:  Lab Results  Component Value Date   ETH <10 24/58/0998     Metabolic Disorder Labs: No results found for: HGBA1C, MPG No results found for: PROLACTIN No results found for: CHOL, TRIG, HDL, CHOLHDL, VLDL, LDLCALC  Physical Findings: AIMS: Facial and Oral Movements Muscles of Facial Expression: None, normal Lips and Perioral Area: None, normal Jaw: None, normal Tongue: None, normal,Extremity Movements Upper (arms, wrists, hands, fingers): None, normal Lower (legs, knees, ankles, toes): None, normal, Trunk Movements Neck, shoulders, hips: None, normal, Overall Severity Severity of abnormal movements (highest score from questions above): None, normal Incapacitation due to abnormal movements: None, normal Patient's awareness of abnormal movements (rate only patient's report): No Awareness, Dental Status Current problems with teeth and/or dentures?: No Does patient usually wear dentures?: No  CIWA:    COWS:     Musculoskeletal: Strength & Muscle Tone: within normal limits Gait & Station: normal Patient leans: N/A  Psychiatric Specialty Exam: Physical Exam  Nursing note and vitals reviewed. Constitutional: He is oriented to person, place, and time. He appears well-developed and well-nourished.  Respiratory: Effort normal.  Musculoskeletal: Normal range of motion.  Neurological: He is alert and oriented to person, place, and time.  Skin: Skin is warm.    Review of Systems  Constitutional: Negative.   HENT: Negative.   Eyes: Negative.   Respiratory: Negative.   Cardiovascular: Negative.   Gastrointestinal: Negative.   Genitourinary: Negative.   Musculoskeletal: Negative.   Skin: Negative.   Neurological: Negative.   Endo/Heme/Allergies: Negative.   Psychiatric/Behavioral: Negative.     Blood pressure 118/82, pulse (!) 101, temperature 98.6 F (37 C), temperature source Oral, resp. rate 18, height 5' 8" (1.727 m), weight 91.2 kg (201 lb), SpO2 100 %.Body mass index is 30.56 kg/m.  General Appearance: Casual  Eye Contact:   Good  Speech:  Clear and Coherent and Normal Rate  Volume:  Normal  Mood:  Euthymic  Affect:  Congruent  Thought Process:  Goal Directed and Descriptions of Associations: Intact  Orientation:  Full (Time, Place, and Person)  Thought Content:  WDL  Suicidal Thoughts:  No  Homicidal Thoughts:  No  Memory:  Immediate;   Good Recent;   Good Remote;   Good  Judgement:  Good  Insight:  Good  Psychomotor Activity:  Normal  Concentration:  Concentration: Good and Attention Span: Good  Recall:  Good  Fund of Knowledge:  Good  Language:  Good  Akathisia:  No  Handed:  Right  AIMS (if indicated):     Assets:  Communication Skills Desire for Improvement Financial Resources/Insurance Housing Physical Health Social Support Transportation  ADL's:  Intact  Cognition:  WNL  Sleep:  Number of Hours: 6.75   Problems Addressed: MDD severe  Treatment Plan Summary: Daily contact with patient to assess and  evaluate symptoms and progress in treatment, Medication management and Plan is to:  -Continue Vistaril 25 mg PO TID and QHS PRN for anxiety and sleep -Continue Trazodone 50 mg PO QHS PRN for insomnia -Encourage group therapy participation    Lewis Shock, FNP 09/18/2017, 2:14 PM   I reviewed the notes and met with him. Patient is doing well without taking any medications. He repeatedly denies any intent to harm self. His wife collaborated reports of marital conflict and financial difficulties. She is coming to visit him tonight. Patient could be discharge soon if we have positive feedback with respect to dangerousness. He plans to get back to work soon.

## 2017-09-18 NOTE — Tx Team (Signed)
Interdisciplinary Treatment and Diagnostic Plan Update  09/18/2017 Time of Session: 0928 Derrick Webb MRN: [id number]  Principal Diagnosis: MDD (major depressive disorder), single episode, severe , no psychosis ([facility])  Secondary Diagnoses: Principal Problem:   MDD (major depressive disorder), single episode, severe , no psychosis ([facility])   Current Medications:  Current Facility-Administered Medications  Medication Dose Route Frequency Provider Last Rate Last Dose  . acetaminophen (TYLENOL) tablet 650 mg  650 mg Oral Q6H PRN Laveda AbbeParks, Laurie Britton, NP      . alum & mag hydroxide-simeth (MAALOX/MYLANTA) 200-200-20 MG/5ML suspension 30 mL  30 mL Oral Q4H PRN Laveda AbbeParks, Laurie Britton, NP      . hydrOXYzine (ATARAX/VISTARIL) tablet 25 mg  25 mg Oral TID PRN Laveda AbbeParks, Laurie Britton, NP      . hydrOXYzine (ATARAX/VISTARIL) tablet 25 mg  25 mg Oral QHS Laveda AbbeParks, Laurie Britton, NP      . magnesium hydroxide (MILK OF MAGNESIA) suspension 30 mL  30 mL Oral Daily PRN Laveda AbbeParks, Laurie Britton, NP      . traZODone (DESYREL) tablet 50 mg  50 mg Oral QHS PRN Laveda AbbeParks, Laurie Britton, NP       PTA Medications: No medications prior to admission.    Patient Stressors: Marital or family conflict  Patient Strengths: General fund of knowledge  Treatment Modalities: Medication Management, Group therapy, Case management,  1 to 1 session with clinician, Psychoeducation, Recreational therapy.   Physician Treatment Plan for Primary Diagnosis: MDD (major depressive disorder), single episode, severe , no psychosis ([facility]) Long Term Goal(s): Improvement in symptoms so as ready for discharge Improvement in symptoms so as ready for discharge   Short Term Goals: Ability to identify changes in lifestyle to reduce recurrence of condition will improve Ability to verbalize feelings will improve Ability to disclose and discuss suicidal ideas Ability to demonstrate self-control will improve Ability to identify and develop effective  coping behaviors will improve Ability to maintain clinical measurements within normal limits will improve Compliance with prescribed medications will improve Ability to identify changes in lifestyle to reduce recurrence of condition will improve Ability to verbalize feelings will improve Ability to disclose and discuss suicidal ideas Ability to demonstrate self-control will improve Ability to identify and develop effective coping behaviors will improve Ability to maintain clinical measurements within normal limits will improve Compliance with prescribed medications will improve  Medication Management: Evaluate patient's response, side effects, and tolerance of medication regimen.  Therapeutic Interventions: 1 to 1 sessions, Unit Group sessions and Medication administration.  Evaluation of Outcomes: Progressing  Physician Treatment Plan for Secondary Diagnosis: Principal Problem:   MDD (major depressive disorder), single episode, severe , no psychosis ([facility])  Long Term Goal(s): Improvement in symptoms so as ready for discharge Improvement in symptoms so as ready for discharge   Short Term Goals: Ability to identify changes in lifestyle to reduce recurrence of condition will improve Ability to verbalize feelings will improve Ability to disclose and discuss suicidal ideas Ability to demonstrate self-control will improve Ability to identify and develop effective coping behaviors will improve Ability to maintain clinical measurements within normal limits will improve Compliance with prescribed medications will improve Ability to identify changes in lifestyle to reduce recurrence of condition will improve Ability to verbalize feelings will improve Ability to disclose and discuss suicidal ideas Ability to demonstrate self-control will improve Ability to identify and develop effective coping behaviors will improve Ability to maintain clinical measurements within normal limits will  improve Compliance with prescribed medications will improve  Medication Management: Evaluate patient's response, side effects, and tolerance of medication regimen.  Therapeutic Interventions: 1 to 1 sessions, Unit Group sessions and Medication administration.  Evaluation of Outcomes: Progressing   RN Treatment Plan for Primary Diagnosis: MDD (major depressive disorder), single episode, severe , no psychosis ([facility]) Long Term Goal(s): Knowledge of disease and therapeutic regimen to maintain health will improve  Short Term Goals: Ability to identify and develop effective coping behaviors will improve and Compliance with prescribed medications will improve  Medication Management: RN will administer medications as ordered by provider, will assess and evaluate patient's response and provide education to patient for prescribed medication. RN will report any adverse and/or side effects to prescribing provider.  Therapeutic Interventions: 1 on 1 counseling sessions, Psychoeducation, Medication administration, Evaluate responses to treatment, Monitor vital signs and CBGs as ordered, Perform/monitor CIWA, COWS, AIMS and Fall Risk screenings as ordered, Perform wound care treatments as ordered.  Evaluation of Outcomes: Progressing   LCSW Treatment Plan for Primary Diagnosis: MDD (major depressive disorder), single episode, severe , no psychosis ([facility]) Long Term Goal(s): Safe transition to appropriate next level of care at discharge, Engage patient in therapeutic group addressing interpersonal concerns.  Short Term Goals: Engage patient in aftercare planning with referrals and resources, Increase social support and Increase skills for wellness and recovery  Therapeutic Interventions: Assess for all discharge needs, 1 to 1 time with Social worker, Explore available resources and support systems, Assess for adequacy in community support network, Educate family and significant other(s) on suicide  prevention, Complete Psychosocial Assessment, Interpersonal group therapy.  Evaluation of Outcomes: Progressing   Progress in Treatment: Attending groups: Yes. Participating in groups: Yes. Taking medication as prescribed: Yes. Toleration medication: Yes. Family/Significant other contact made: Yes, individual(s) contacted:  wife Patient understands diagnosis: Yes. Discussing patient identified problems/goals with staff: Yes. Medical problems stabilized or resolved: Yes. Denies suicidal/homicidal ideation: Yes. Issues/concerns per patient self-inventory: No. Other: none  New problem(s) identified: No, Describe:  none  New Short Term/Long Term Goal(s):Pt goal: "clarify things with regards to my marriage."  Discharge Plan or Barriers:   Reason for Continuation of Hospitalization: Depression Medication stabilization  Estimated Length of Stay: 1-2 days.  Attendees: Patient: Derrick Webb 09/18/2017   Physician: Dr Jackquline Berlin, MD 09/18/2017   Nursing: Joslyn Devon, RN 09/18/2017   RN Care Manager: 09/18/2017   Social Worker: Daleen Squibb, LCSW 09/18/2017   Recreational Therapist:  09/18/2017   Other:  09/18/2017   Other:  09/18/2017   Other: 09/18/2017        Scribe for Treatment Team: Lorri Frederick, LCSW 09/18/2017 2:41 PM

## 2017-09-19 DIAGNOSIS — F411 Generalized anxiety disorder: Secondary | ICD-10-CM

## 2017-09-19 NOTE — Progress Notes (Signed)
Adult Psychoeducational Group Note  Date:  09/19/2017 Time:  9:57 PM  Group Topic/Focus:  Wrap-Up Group:   The focus of this group is to help patients review their daily goal of treatment and discuss progress on daily workbooks.  Participation Level:  Active  Participation Quality:  Appropriate  Affect:  Appropriate  Cognitive:  Alert  Insight: Appropriate  Engagement in Group:  Engaged  Modes of Intervention:  Discussion  Additional Comments:  Patient stated having a pretty good day. Patient's goal for today was to remain positive.  Valoria Tamburri L Jerrel Tiberio 09/19/2017, 9:57 PM

## 2017-09-19 NOTE — Progress Notes (Signed)
Adult Psychoeducational Group Note  Date:  09/19/2017 Time:  1615 Group Topic/Focus:   Overcoming Stress:   The focus of this group is to define stress and help patients assess their triggers.  Participation Level:  Minimal  Participation Quality:  Appropriate  Affect:  Appropriate  Cognitive:  Appropriate  Insight: Appropriate  Engagement in Group:  Engaged  Modes of Intervention:  Discussion, Education, Role-play and Support  Additional Comments:  Group focus on grounding exercises  [name], [name] Derrick Webb 09/19/2017, 5:14 PM

## 2017-09-19 NOTE — Progress Notes (Signed)
Recreation Therapy Notes  Date: 2.19.19 Time: 2:45 pm  [address]   AAA/T Program Assumption of Risk Form signed by Patient/ or Parent Legal Guardian Yes  Patient is free of allergies or sever asthma Yes  Patient reports no fear of animals Yes  Patient reports no history of cruelty to animals Yes  Patient understands his/her participation is voluntary Yes  Patient washes hands before animal contact Yes  Patient washes hands after animal contact Yes  Behavioral Response: Engaged   Education:Hand Washing, Appropriate Animal Interaction   Education Outcome: Acknowledges education.   Clinical Observations/Feedback: Patient attended session and interacted appropriately with therapy dog and peers. Patient asked appropriate questions about therapy dog and his training. Patient shared stories about their pets at home with group.   Derrick Webb, Recreation Therapy Intern   Derrick Webb 09/19/2017 3:05 PM

## 2017-09-19 NOTE — Progress Notes (Signed)
D: Pt was in the dayroom upon initial approach.  Pt presents with appropriate affect and mood.  He reports his day was "great."  His goal was to "stay positive" and pt reports he met goal.  Pt denies SI/HI, denies hallucinations, denies pain.  Pt has been visible in milieu interacting with peers and staff appropriately.  Pt attended evening group.    A: Introduced self to pt.  Actively listened to pt and offered support and encouragement. Medication offered per order.  Q15 minute safety checks maintained.  R: Pt is safe on the unit.  Pt refused scheduled Vistaril, reporting he does not need it.  Pt verbally contracts for safety.  Will continue to monitor and assess.

## 2017-09-19 NOTE — Progress Notes (Signed)
Adult Psychoeducational Group Note  Date:  09/19/2017 Time:  0830 Group Topic/Focus:  Goals Group:   The focus of this group is to help patients establish daily goals to achieve during treatment and discuss how the patient can incorporate goal setting into their daily lives to aide in recovery.  Participation Level:  Active  Participation Quality:  Appropriate  Affect:  Appropriate  Cognitive:  Appropriate  Insight: Appropriate and Good  Engagement in Group:  Engaged  Modes of Intervention:  Discussion and Orientation  Additional Comments:    Maretta Losli, Rayden Scheper Patience 09/19/2017, 10:40 AM

## 2017-09-19 NOTE — [facility] Group Notes (Signed)
[facility] LCSW Group Therapy Note  Date/Time; 09/19/17, 1315  Type of Therapy/Topic:  Group Therapy:  Feelings about Diagnosis  Participation Level:  None   Mood:   Description of Group:    This group will allow patients to explore their thoughts and feelings about diagnoses they have received. Patients will be guided to explore their level of understanding and acceptance of these diagnoses. Facilitator will encourage patients to process their thoughts and feelings about the reactions of others to their diagnosis, and will guide patients in identifying ways to discuss their diagnosis with significant others in their lives. This group will be process-oriented, with patients participating in exploration of their own experiences as well as giving and receiving support and challenge from other group members.   Therapeutic Goals: 1. Patient will demonstrate understanding of diagnosis as evidence by identifying two or more symptoms of the disorder:  2. Patient will be able to express two feelings regarding the diagnosis 3. Patient will demonstrate ability to communicate their needs through discussion and/or role plays  Summary of Patient Progress:Pt attended group but did not participate in the discussion.        Therapeutic Modalities:   Cognitive Behavioral Therapy Brief Therapy Feelings Identification   Daleen SquibbGreg Nikolus Marczak, LCSW

## 2017-09-19 NOTE — Progress Notes (Signed)
Adult Psychoeducational Group Note  Date:  09/19/2017 Time:  1515 Group Topic/Focus:  Relaxation activities.   The purpose of this group is to help patients identify activities for coping with anxiety and depression.   Participation Level:  Active  Participation Quality:  Appropriate  Affect:  Appropriate  Cognitive:  Appropriate  Insight: Appropriate  Engagement in Group:  Engaged  Modes of Intervention:  Activity  Additional Comments:    Nykolas Bacallao L 09/19/2017  

## 2017-09-19 NOTE — Progress Notes (Signed)
Pt presents with a flat affect and anxious mood. Pt noted to be fidgety on approach. Pt forwarded little information and denied any symptoms of depression, anxiety, SI/HI or insomnia. Pt stated that he's not on any medications because when he was in the ED they gave him some meds that caused his B/P to rise and he's decided not to take any meds. Per pt "I don't understand why they offered me meds for anxiety last night and I don't have any anxiety". Orders reviewed with pt. Verbal support provided. Pt encouraged to attend scheduled groups. 15 minute checks performed for safety.

## 2017-09-19 NOTE — Plan of Care (Signed)
  Progressing Activity: Interest or engagement in activities will improve 09/19/2017 1132 - Progressing by Layla BarterWhite, Abishai Viegas L, RN Sleeping patterns will improve 09/19/2017 1132 - Progressing by Layla BarterWhite, Valeen Borys L, RN Coping: Ability to demonstrate self-control will improve 09/19/2017 1132 - Progressing by Layla BarterWhite, Legacy Carrender L, RN   Progressing Activity: Interest or engagement in activities will improve 09/19/2017 1132 - Progressing by Layla BarterWhite, Rajinder Mesick L, RN Sleeping patterns will improve 09/19/2017 1132 - Progressing by Layla BarterWhite, Raffaele Derise L, RN Coping: Ability to demonstrate self-control will improve 09/19/2017 1132 - Progressing by Layla BarterWhite, Deckard Stuber L, RN   Progressing Activity: Interest or engagement in activities will improve 09/19/2017 1132 - Progressing by Layla BarterWhite, Khup Sapia L, RN Sleeping patterns will improve 09/19/2017 1132 - Progressing by Layla BarterWhite, Ruqaya Strauss L, RN Coping: Ability to demonstrate self-control will improve 09/19/2017 1132 - Progressing by Layla BarterWhite, Dvontae Ruan L, RN   Progressing Activity: Interest or engagement in activities will improve 09/19/2017 1132 - Progressing by Layla BarterWhite, Izacc Demeyer L, RN Sleeping patterns will improve 09/19/2017 1132 - Progressing by Layla BarterWhite, Emmanuel Gruenhagen L, RN Coping: Ability to demonstrate self-control will improve 09/19/2017 1132 - Progressing by Layla BarterWhite, Braydyn Schultes L, RN

## 2017-09-19 NOTE — Progress Notes (Signed)
  DATA ACTION RESPONSE  Objective- Pt. is visible in the dayroom, seen watching TV. No interaction.Presents with a flat/anxious affect and mood.Pt states he hopes to be d/c tomorrow.  Subjective- Denies having any SI/HI/AVH/Pain at this time. Is cooperative and remain safe on the unit.  1:1 interaction in private to establish rapport. Encouragement, education, & support given from staff. Bedtime vistaril refused.   Safety maintained with Q 15 checks. Continue with POC.

## 2017-09-19 NOTE — Progress Notes (Signed)
[facility] MD Progress Note  09/19/2017 5:18 PM Derrick Webb  MRN:  [id number]   Subjective:  Patient states " I am doing all right". Endorses feeling sad at times, but denies significant neuro-vegetative symptoms of depression at present.  Denies suicidal ideations. Attributes sadness to his mother having advancing dementia, and states that because she lives a few hours away it is hard for him to see her as often as he would want to. Regarding events that led to admission, states he made suicidal statements because at the time he felt it would help him " win" the argument. States he had no actual plan or intention of hurting self or of suicide . He is currently future oriented, and hoping for discharge soon.     Objective:  I have discussed case with treatment team and have met with patient. 47 year old male, married , admitted under IVC following making suicidal statements during argument with wife . As above, currently denies any  suicidal ideations and states statements were made to evoke response from wife and in the context of argument .  At this time minimizes depression or neuro-vegetative symptoms of depression, although does endorse some sadness regarding his mother's illness. With his express consent I spoke with his wife via phone- she corroborates that he presents improved and is agreeing with his being discharged soon. She states she realizes that he has made suicidal statements in an effort to affect arguments , but states " I need to take those things he says seriously ". She does feel he has struggled with depression.  Patient minimizes depression at this time, does  expresses insight regarding recent suicidal statements - states " will not do that again".  I know that if I make those statements there will be consequences", and that he is aware it causes his wife to worry .  At this time he is future oriented, and hoping for discharge soon to reunite with wife and child .  Patient is not  currently on standing psychiatric medication. We discussed starting an antidepressant medication , but he states he does not think he needs medication at this time, and also expresses concern about possible side effects. Behavior on unit calm and in good control, pleasant on approach.    Principal Problem: MDD (major depressive disorder), single episode, severe , no psychosis ([place]) Diagnosis:   Patient Active Problem List   Diagnosis Date Noted  . Generalized anxiety disorder [F41.1] 09/16/2017  . Major depressive disorder, single episode, severe ([place]) [F32.2] 09/16/2017  . MDD (major depressive disorder), single episode, severe , no psychosis ([place]) [F32.2] 09/16/2017  . Tobacco abuse [Z72.0] 10/19/2016  . Sleep apnea [G47.30] 10/19/2016  . Palpitations [R00.2] 10/19/2016  . Shortness of breath [R06.02] 10/19/2016   Total Time spent with patient: 20 minutes  Past Psychiatric History: See H&P  Past Medical History:  Past Medical History:  Diagnosis Date  . Palpitations 10/19/2016  . Shortness of breath 10/19/2016  . Sleep apnea 10/19/2016  . Tobacco abuse 10/19/2016   History reviewed. No pertinent surgical history. Family History:  Family History  Problem Relation Age of Onset  . Dementia Mother   . Hypertension Father   . Dementia Maternal Grandmother   . Sleep apnea Brother    Family Psychiatric  History: See H&P Social History:  Social History   Substance and Sexual Activity  Alcohol Use Yes   Comment: occ     Social History   Substance and Sexual Activity  Drug Use No    Social History   Socioeconomic History  . Marital status: Single    Spouse name: None  . Number of children: None  . Years of education: None  . Highest education level: None  Social Needs  . Financial resource strain: None  . Food insecurity - worry: None  . Food insecurity - inability: None  . Transportation needs - medical: None  . Transportation needs - non-medical: None   Occupational History  . None  Tobacco Use  . Smoking status: Current Every Day Smoker  . Smokeless tobacco: Never Used  Substance and Sexual Activity  . Alcohol use: Yes    Comment: occ  . Drug use: No  . Sexual activity: None  Other Topics Concern  . None  Social History Narrative  . None   Additional Social History:   Sleep: Good  Appetite:  Good  Current Medications: Current Facility-Administered Medications  Medication Dose Route Frequency Provider Last Rate Last Dose  . acetaminophen (TYLENOL) tablet 650 mg  650 mg Oral Q6H PRN Ethelene Hal, NP      . alum & mag hydroxide-simeth (MAALOX/MYLANTA) 200-200-20 MG/5ML suspension 30 mL  30 mL Oral Q4H PRN Ethelene Hal, NP      . hydrOXYzine (ATARAX/VISTARIL) tablet 25 mg  25 mg Oral TID PRN Ethelene Hal, NP      . hydrOXYzine (ATARAX/VISTARIL) tablet 25 mg  25 mg Oral QHS Ethelene Hal, NP      . magnesium hydroxide (MILK OF MAGNESIA) suspension 30 mL  30 mL Oral Daily PRN Ethelene Hal, NP      . traZODone (DESYREL) tablet 50 mg  50 mg Oral QHS PRN Ethelene Hal, NP        Lab Results: No results found for this or any previous visit (from the past 65 hour(s)).  Blood Alcohol level:  Lab Results  Component Value Date   ETH <10 82/99/3716    Metabolic Disorder Labs: No results found for: HGBA1C, MPG No results found for: PROLACTIN No results found for: CHOL, TRIG, HDL, CHOLHDL, VLDL, LDLCALC  Physical Findings: AIMS: Facial and Oral Movements Muscles of Facial Expression: None, normal Lips and Perioral Area: None, normal Jaw: None, normal Tongue: None, normal,Extremity Movements Upper (arms, wrists, hands, fingers): None, normal Lower (legs, knees, ankles, toes): None, normal, Trunk Movements Neck, shoulders, hips: None, normal, Overall Severity Severity of abnormal movements (highest score from questions above): None, normal Incapacitation due to abnormal  movements: None, normal Patient's awareness of abnormal movements (rate only patient's report): No Awareness, Dental Status Current problems with teeth and/or dentures?: No Does patient usually wear dentures?: No  CIWA:    COWS:     Musculoskeletal: Strength & Muscle Tone: within normal limits Gait & Station: normal Patient leans: N/A  Psychiatric Specialty Exam: Physical Exam  Nursing note and vitals reviewed. Constitutional: He is oriented to person, place, and time. He appears well-developed and well-nourished.  Respiratory: Effort normal.  Musculoskeletal: Normal range of motion.  Neurological: He is alert and oriented to person, place, and time.  Skin: Skin is warm.    Review of Systems  Constitutional: Negative.   HENT: Negative.   Eyes: Negative.   Respiratory: Negative.   Cardiovascular: Negative.   Gastrointestinal: Negative.   Genitourinary: Negative.   Musculoskeletal: Negative.   Skin: Negative.   Neurological: Negative.   Endo/Heme/Allergies: Negative.   Psychiatric/Behavioral: Negative.   denies headache, no chest pain, no shortness  of breath  Blood pressure 133/85, pulse 92, temperature 98.8 F (37.1 C), temperature source Oral, resp. rate (!) 79, height '5\' 8"'$  (1.727 m), weight 91.2 kg (201 lb), SpO2 100 %.Body mass index is 30.56 kg/m.  General Appearance: Well Groomed  Eye Contact:  Good  Speech:  Normal Rate  Volume:  Normal  Mood:  denies depression  Affect:  mildly constricted, but smiles appropriately during session  Thought Process:  Linear and Descriptions of Associations: Intact  Orientation:  Other:  fully alert and attentive   Thought Content:  no hallucinations, no delusions, not internally preoccupied   Suicidal Thoughts:  No denies any suicidal or self injurious ideations, denies any homicidal or violent ideations, contracts for safety on unit   Homicidal Thoughts:  No  Memory:  recent and remote grossly intact   Judgement:  Other:  fair-  improving   Insight:  Fair and improving   Psychomotor Activity:  Normal  Concentration:  Concentration: Good and Attention Span: Good  Recall:  Good  Fund of Knowledge:  Good  Language:  Good  Akathisia:  No  Handed:  Right  AIMS (if indicated):     Assets:  Communication Skills Desire for Improvement Financial Resources/Insurance Housing Physical Health Social Support Transportation  ADL's:  Intact  Cognition:  WNL  Sleep:  Number of Hours: 6.5   Assessment - patient denies significant depression or neuro-vegetative symptoms of MDD. States he has been sad due to his mother having dementia, but denies any recent or current suicidal ideations, and states recent suicidal statements were made in a conscious attempt to " win" an argument with his wife. He is future oriented . Currently not on any standing psychiatric medication regimen.   Treatment Plan Summary: Daily contact with patient to assess and evaluate symptoms and progress in treatment, Medication management and Plan is to:  -Treatment Plan reviewed as below -Continue Vistaril 25 mg PO TID and QHS PRN for anxiety and sleep -Continue Trazodone 50 mg PO QHS PRN for insomnia -Encourage group therapy participation to work on Radiographer, therapeutic and symptom reduction -Treatment Team working on discharge planning options    Derrick Campus, MD 09/19/2017, 5:18 PM    Patient ID: Melven Sartorius, male   DOB: [birth year], 47 y.o.   MRN: [id number]

## 2017-09-20 NOTE — Progress Notes (Signed)
Adult Psychoeducational Group Note  Date:  09/20/2017 Time:  0830 Group Topic/Focus:  Goals Group:   The focus of this group is to help patients establish daily goals to achieve during treatment and discuss how the patient can incorporate goal setting into their daily lives to aide in recovery.  Participation Level:  Active  Participation Quality:  Appropriate  Affect:  Appropriate  Cognitive:  Appropriate  Insight: Appropriate  Engagement in Group:  Engaged  Modes of Intervention:  Discussion, Orientation and Support  Additional Comments: Pt attended orientation/goal group   [name], Peniel Hass Patience 09/20/2017, 10:59 AM

## 2017-09-20 NOTE — Progress Notes (Signed)
Recreation Therapy Notes  Date: 09/20/17 Time: 0930 Location: 300 Hall Dayroom  Group Topic: Stress Management  Goal Area(s) Addresses:  Patient will verbalize importance of using healthy stress management.  Patient will identify positive emotions associated with healthy stress management.   Intervention: Stress Management  Activity :  Forest Visualization.  LRT introduced the stress management technique of guided imagery.  LRT read a script to allow patients to visualize the sights and sounds of being in the forest.  Patients were to follow along as the script was read to participate in activity.  Education: Stress Management, Discharge Planning.   Education Outcome: Acknowledges edcuation/In group clarification offered/Needs additional education  Clinical Observations/Feedback: Pt did not attend group.    Marchelle Rinella, LRT/CTRS         Luman Holway A 09/20/2017 1:11 PM 

## 2017-09-20 NOTE — [facility] Suicide Risk Assessment (Signed)
[facility] Discharge Suicide Risk Assessment   Principal Problem: MDD (major depressive disorder), single episode, severe , no psychosis ([facility]) Discharge Diagnoses:  Patient Active Problem List   Diagnosis Date Noted  . Generalized anxiety disorder [F41.1] 09/16/2017  . Major depressive disorder, single episode, severe ([facility]) [F32.2] 09/16/2017  . MDD (major depressive disorder), single episode, severe , no psychosis ([facility]) [F32.2] 09/16/2017  . Tobacco abuse [Z72.0] 10/19/2016  . Sleep apnea [G47.30] 10/19/2016  . Palpitations [R00.2] 10/19/2016  . Shortness of breath [R06.02] 10/19/2016    Total Time spent with patient: 30 minutes  Musculoskeletal: Strength & Muscle Tone: within normal limits Gait & Station: normal Patient leans: N/A  Psychiatric Specialty Exam: ROSdenies headache, no chest pain, no shortness of breath, no vomiting   Blood pressure 138/90, pulse 84, temperature 97.9 F (36.6 C), temperature source Oral, resp. rate 16, height 5\' 8"  (1.727 m), weight 91.2 kg (201 lb), SpO2 100 %.Body mass index is 30.56 kg/m.  General Appearance: Well Groomed  Eye Contact::  Good  Speech:  Normal Rate409  Volume:  Normal  Mood:  reports " I feel pretty good right now". States " I feel a lot better than before "  Affect:  Appropriate  Thought Process:  Goal Directed and Linear  Orientation:  Full (Time, Place, and Person)  Thought Content:  no hallucinations, no delusions, not internally preoccupied   Suicidal Thoughts:  No denies any suicidal or self injurious ideations, denies any homicidal or violent ideations, and presents future oriented   Homicidal Thoughts:  No  Memory:  recent and remote grossly intact   Judgement:  Other:  improving   Insight:  fair- improving  Psychomotor Activity:  Normal  Concentration:  Good  Recall:  Good  Fund of Knowledge:Good  Language: Good  Akathisia:  Negative  Handed:  Right  AIMS (if indicated):     Assets:  Communication Skills Desire  for Improvement Resilience  Sleep:  Number of Hours: 6.75  Cognition: WNL  ADL's:  Intact   Mental Status Per Nursing Assessment::   On Admission:  NA  Demographic Factors:  47 year old married male, has one child, employed   Loss Factors: Marital relationship stressor  Historical Factors: No prior psychiatric admissions, no history of suicidal attempts, no history of self injurious behaviors, no history of psychosis.   Risk Reduction Factors:   Responsible for children under 52 years of age, Sense of responsibility to family, Employed and Living with another person, especially a relative  Continued Clinical Symptoms:  At this time patient is alert, attentive, well related, calm, reports mood as "OK", denies feeling depressed, and states he feels better than he did prior to admission, affect is more reactive, no thought disorder, no suicidal or self injurious ideations, and as noted in prior notes, states suicidal statements he had made before admissions were in the context of an argument and that his purpose was to " win the argument ". States " I was not really suicidal , I had no real thoughts of hurting myself".  States he has had phone conversations with wife on phone and that their relationship is now improved .  Denies homicidal ideations, no psychotic symptoms, future oriented , looking forward to reuniting with family and states he intends to return to work tomorrow. Behavior on unit calm and in good control, polite on approach. He is not currently on any standing psychiatric medication.    Cognitive Features That Contribute To Risk:  No gross  cognitive deficits noted upon discharge. Is alert , attentive, and oriented x 3   Suicide Risk:  Mild:  Suicidal ideation of limited frequency, intensity, duration, and specificity.  There are no identifiable plans, no associated intent, mild dysphoria and related symptoms, good self-control (both objective and subjective assessment),  few other risk factors, and identifiable protective factors, including available and accessible social support.  Follow-up Information    [facility].   Why:  Please attend your therapy appt with Lindon RompByron Zellnick on Tuesday, 09/26/17, at 2:00pm. Contact information: [address] [place] [address] [phone]           Plan Of Care/Follow-up recommendations:  Activity:  as tolerated  Diet:  regular Tests:  NA Other:  See below  Patient is expressing readiness for discharge, there are no current grounds for involuntary commitment. Plans to return home, states wife will pick him up later today. Follow up as above . He has a PCP he plans to follow up with for medical management as needed .  Craige CottaFernando A Sholonda Jobst, MD 09/20/2017, 11:06 AM

## 2017-09-20 NOTE — Progress Notes (Signed)
Pt received both written and verbal discharge instructions. Pt verbalized understanding of discharge instructions. Pt agreed to f/u appt and med regimen. Pt received d/c packet with forms and prescriptions. Pt gathered belongings from room and locker. Pt safely discharged to the lobby.

## 2017-09-20 NOTE — [facility] Group Notes (Signed)
[facility]                                                             09/20/2017  2:53 PM  Type of Therapy: Mental Health Association Presentation  Participation Level: Active   Participation Quality: Attentive  Affect: Appropriate  Cognitive: Oriented  Insight: Developing/Improving  Engagement in Therapy: Engaged  Modes of Intervention: Discussion, Education and Socialization  Summary of Progress/Problems: Pateint was attentive and involved with the group  Melba CoonAngel Zarielle Cea, SW Intern

## 2017-09-20 NOTE — Discharge Summary (Addendum)
Physician Discharge Summary Note  Patient:  Derrick Webb is an 47 y.o., male MRN:  [id number] DOB:  1971-03-26 Patient phone:  [phone] (home)  Patient address:   [address] [place] [id number],  Total Time spent with patient: 20 minutes  Date of Admission:  09/16/2017 Date of Discharge: 09/20/17   Reason for Admission:  Worsening depression with SI  Principal Problem: MDD (major depressive disorder), single episode, severe , no psychosis [facility](HCC) Discharge Diagnoses: Patient Active Problem List   Diagnosis Date Noted  . Generalized anxiety disorder [F41.1] 09/16/2017  . Major depressive disorder, single episode, severe (HCC) [F32.2] 09/16/2017  . MDD (major depressive disorder), single episode, severe , no psychosis (HCC) [F32.2] 09/16/2017  . Tobacco abuse [Z72.0] 10/19/2016  . Sleep apnea [G47.30] 10/19/2016  . Palpitations [R00.2] 10/19/2016  . Shortness of breath [R06.02] 10/19/2016    Past Psychiatric History: No past history of depression. Has had panic attack in the past.  No past history of mania. No past history of psychosis. No past history of suicidal behavior. No past history of violent behavior. He has never been on any psychotropic medication in the past. No past out or inpatient treatment. No past psychological treatment. No past history of substance abuse  Past Medical History:  Past Medical History:  Diagnosis Date  . Palpitations 10/19/2016  . Shortness of breath 10/19/2016  . Sleep apnea 10/19/2016  . Tobacco abuse 10/19/2016   History reviewed. No pertinent surgical history. Family History:  Family History  Problem Relation Age of Onset  . Dementia Mother   . Hypertension Father   . Dementia Maternal Grandmother   . Sleep apnea Brother    Family Psychiatric  History: Denies any family history of mental illness, substance use disorder or suicide.   Social History:  Social History   Substance and Sexual Activity  Alcohol Use Yes   Comment:  occ     Social History   Substance and Sexual Activity  Drug Use No    Social History   Socioeconomic History  . Marital status: Single    Spouse name: None  . Number of children: None  . Years of education: None  . Highest education level: None  Social Needs  . Financial resource strain: None  . Food insecurity - worry: None  . Food insecurity - inability: None  . Transportation needs - medical: None  . Transportation needs - non-medical: None  Occupational History  . None  Tobacco Use  . Smoking status: Current Every Day Smoker  . Smokeless tobacco: Never Used  Substance and Sexual Activity  . Alcohol use: Yes    Comment: occ  . Drug use: No  . Sexual activity: None  Other Topics Concern  . None  Social History Narrative  . None    Hospital Course:   [facility]: 47 y.o AAM, married, lives with his family, employed. No past history of mental illness. No substance use. Presented to the ER involuntarily. Was reported to have gotten into an argument with his wife. Expressed suicidal thoughts at home. Entered his car and drove off. His wife initiated IVC as patient had been threatening to shoot self or crash his car. Reports long standing strained relationship with his wife. Their 35seven year old daughter has been protective. Recent stressor as marital and financial concerns. Routine labs are within normal limits. No alcohol or illicit substance. His wife has secured his gun. At interview, patient reports that he has  been functioning well at work and at home. He is a Merchandiser, retail at the [employer]. Says they have been married for nine years. In the past couple of years they have been having a lot of marital strain. Says after an argument he told his wife "if anything happen's to me make sure Dess is taken care of". Patient then went into his car and zoomed off. Says he had no intention to harm self. Says he has used that a a negotiating chip in the past. Repeatedly  tells me that he has never had the intention. Says he cannot conceive doing such a thing to his daughter. Says his daughter's birthday is tomorrow "she is my world". Patient says he has not been feeling depressed. He has periods of sadness. Says he is able to shake it off after a few minutes. He has normal appetite, normal sleep and normal energy. Says he has not been ruminating on any issue. Says he has been considering family therapy or divorce. Says he can understand why his wife felt alarmed. No associated psychosis. No evidence of mania. No overwhelming anxiety. No evidence of PTSD. No substance use. No thoughts of harming others. No thoughts of violence.  Patient remained on the [facility] unit for 3 days and stabilized with therapy. Patient refused to start any medications while in [facility], even PRN medications. Patient has discussed with his wife and they are going to go to counseling after discharge. He agrees to discuss medication options with his outpatient provider. Patient showed improvement with improved mood, affect, sleep, appetite, and interaction. Patient has been attending group sand participating. He has been seen in the day room interacting with peers and staff appropriately. He denies any SI/HI/AVH and contracts for safety. He agrees to follow up at [facility] and [facility]. He is not provided with prescriptions or samples of medications upon discharge.      Physical Findings: AIMS: Facial and Oral Movements Muscles of Facial Expression: None, normal Lips and Perioral Area: None, normal Jaw: None, normal Tongue: None, normal,Extremity Movements Upper (arms, wrists, hands, fingers): None, normal Lower (legs, knees, ankles, toes): None, normal, Trunk Movements Neck, shoulders, hips: None, normal, Overall Severity Severity of abnormal movements (highest score from questions above): None, normal Incapacitation due to abnormal movements: None, normal Patient's  awareness of abnormal movements (rate only patient's report): No Awareness, Dental Status Current problems with teeth and/or dentures?: No Does patient usually wear dentures?: No  CIWA:    COWS:     Musculoskeletal: Strength & Muscle Tone: within normal limits Gait & Station: normal Patient leans: N/A  Psychiatric Specialty Exam: Physical Exam  Nursing note and vitals reviewed. Constitutional: He is oriented to person, place, and time. He appears well-developed and well-nourished.  Cardiovascular: Normal rate.  Respiratory: Effort normal.  Musculoskeletal: Normal range of motion.  Neurological: He is alert and oriented to person, place, and time.  Skin: Skin is warm.    Review of Systems  Constitutional: Negative.   HENT: Negative.   Eyes: Negative.   Respiratory: Negative.   Cardiovascular: Negative.   Gastrointestinal: Negative.   Genitourinary: Negative.   Musculoskeletal: Negative.   Skin: Negative.   Neurological: Negative.   Endo/Heme/Allergies: Negative.   Psychiatric/Behavioral: Negative.     Blood pressure 138/90, pulse 84, temperature 97.9 F (36.6 C), temperature source Oral, resp. rate 16, height 5\' 8"  (1.727 m), weight 91.2 kg (201 lb), SpO2 100 %.Body mass index is 30.56 kg/m.  General Appearance: Casual  Eye  Contact:  Good  Speech:  Clear and Coherent and Normal Rate  Volume:  Normal  Mood:  Euthymic  Affect:  Congruent  Thought Process:  Goal Directed and Descriptions of Associations: Intact  Orientation:  Full (Time, Place, and Person)  Thought Content:  WDL  Suicidal Thoughts:  No  Homicidal Thoughts:  No  Memory:  Immediate;   Good Recent;   Good Remote;   Good  Judgement:  Good  Insight:  Good  Psychomotor Activity:  Normal  Concentration:  Concentration: Good and Attention Span: Good  Recall:  Good  Fund of Knowledge:  Good  Language:  Good  Akathisia:  No  Handed:  Right  AIMS (if indicated):     Assets:  Communication Skills Desire  for Improvement Financial Resources/Insurance Housing Physical Health Social Support Transportation  ADL's:  Intact  Cognition:  WNL  Sleep:  Number of Hours: 6.75     Have you used any form of tobacco in the last 30 days? (Cigarettes, Smokeless Tobacco, Cigars, and/or Pipes): Yes  Has this patient used any form of tobacco in the last 30 days? (Cigarettes, Smokeless Tobacco, Cigars, and/or Pipes) Yes, Yes, A prescription for an FDA-approved tobacco cessation medication was offered at discharge and the patient refused  Blood Alcohol level:  Lab Results  Component Value Date   ETH <10 09/16/2017    Metabolic Disorder Labs:  No results found for: HGBA1C, MPG No results found for: PROLACTIN No results found for: CHOL, TRIG, HDL, CHOLHDL, VLDL, LDLCALC  See Psychiatric Specialty Exam and Suicide Risk Assessment completed by Attending Physician prior to discharge.  Discharge destination:  Home  Is patient on multiple antipsychotic therapies at discharge:  No   Has Patient had three or more failed trials of antipsychotic monotherapy by history:  No  Recommended Plan for Multiple Antipsychotic Therapies: NA   Allergies as of 09/20/2017   No Known Allergies     Medication List    You have not been prescribed any medications.    Follow-up Information    [facility] Go on 09/27/2017.   Why:  Please attend your medication appt  on Wednesday, 09/27/17, at 2:00pm with Dr Toni Arthurs. Contact information: [address] [place] [address] [phone]        [facility] Go on 09/26/2017.   Why:  Please attend your therapy appt with Lindon Romp on Tuesday, 09/26/17, at 2:00pm. Contact information: [address] [place] [address] [phone]           Follow-up recommendations:  Continue activity as tolerated. Continue diet as recommended by your PCP. Ensure to keep all appointments with outpatient  providers.  Comments:  Patient is instructed prior to discharge to: Take all medications as prescribed by his/her mental healthcare provider. Report any adverse effects and or reactions from the medicines to his/her outpatient provider promptly. Patient has been instructed & cautioned: To not engage in alcohol and or illegal drug use while on prescription medicines. In the event of worsening symptoms, patient is instructed to call the crisis hotline, 911 and or go to the nearest ED for appropriate evaluation and treatment of symptoms. To follow-up with his/her primary care provider for your other medical issues, concerns and or health care needs.    Signed: Gerlene Burdock Money, FNP 09/20/2017, 8:07 AM   Patient seen, Suicide Assessment Completed.  Disposition Plan Reviewed

## 2017-09-20 NOTE — Progress Notes (Signed)
  [facility] :  Will you be returning to the same living situation after discharge:  Yes,  with wife At discharge, do you have transportation home?: Yes,  wife Do you have the ability to pay for your medications: Yes,  BCBS  Release of information consent forms completed and in the chart;  Patient's signature needed at discharge.  Patient to Follow up at: Follow-up Information    [facility] Go on 09/26/2017.   Why:  Please attend your therapy appt with Lindon RompByron Zellnick on Tuesday, 09/26/17, at 2:00pm. Contact information: [address] [place] [id number] [phone]           Next level of care provider has access to [facility] Link:no  Safety Planning and Suicide Prevention discussed: Yes,  with wife  Have you used any form of tobacco in the last 30 days? (Cigarettes, Smokeless Tobacco, Cigars, and/or Pipes): Yes  Has patient been referred to the Quitline?: Patient refused referral  Patient has been referred for addiction treatment: Yes  Derrick Webb, Derrick Covelli Jon, LCSW 09/20/2017, 10:26 AM

## 2017-09-25 DIAGNOSIS — F321 Major depressive disorder, single episode, moderate: Secondary | ICD-10-CM | POA: Diagnosis not present

## 2017-09-26 DIAGNOSIS — F411 Generalized anxiety disorder: Secondary | ICD-10-CM | POA: Diagnosis not present

## 2017-09-26 DIAGNOSIS — F331 Major depressive disorder, recurrent, moderate: Secondary | ICD-10-CM | POA: Diagnosis not present

## 2017-10-10 DIAGNOSIS — F411 Generalized anxiety disorder: Secondary | ICD-10-CM | POA: Diagnosis not present

## 2017-10-10 DIAGNOSIS — F331 Major depressive disorder, recurrent, moderate: Secondary | ICD-10-CM | POA: Diagnosis not present

## 2017-10-24 DIAGNOSIS — F331 Major depressive disorder, recurrent, moderate: Secondary | ICD-10-CM | POA: Diagnosis not present

## 2017-10-24 DIAGNOSIS — F411 Generalized anxiety disorder: Secondary | ICD-10-CM | POA: Diagnosis not present

## 2017-10-25 DIAGNOSIS — M79645 Pain in left finger(s): Secondary | ICD-10-CM | POA: Diagnosis not present

## 2017-11-01 DIAGNOSIS — F321 Major depressive disorder, single episode, moderate: Secondary | ICD-10-CM | POA: Diagnosis not present

## 2017-11-06 DIAGNOSIS — F411 Generalized anxiety disorder: Secondary | ICD-10-CM | POA: Diagnosis not present

## 2017-11-06 DIAGNOSIS — F331 Major depressive disorder, recurrent, moderate: Secondary | ICD-10-CM | POA: Diagnosis not present

## 2017-11-07 DIAGNOSIS — M79645 Pain in left finger(s): Secondary | ICD-10-CM | POA: Diagnosis not present

## 2017-11-20 DIAGNOSIS — F411 Generalized anxiety disorder: Secondary | ICD-10-CM | POA: Diagnosis not present

## 2017-11-20 DIAGNOSIS — F331 Major depressive disorder, recurrent, moderate: Secondary | ICD-10-CM | POA: Diagnosis not present

## 2017-11-30 ENCOUNTER — Encounter: Payer: Self-pay | Admitting: Cardiovascular Disease

## 2017-12-04 DIAGNOSIS — F411 Generalized anxiety disorder: Secondary | ICD-10-CM | POA: Diagnosis not present

## 2017-12-04 DIAGNOSIS — F331 Major depressive disorder, recurrent, moderate: Secondary | ICD-10-CM | POA: Diagnosis not present

## 2017-12-18 DIAGNOSIS — F411 Generalized anxiety disorder: Secondary | ICD-10-CM | POA: Diagnosis not present

## 2017-12-18 DIAGNOSIS — F331 Major depressive disorder, recurrent, moderate: Secondary | ICD-10-CM | POA: Diagnosis not present

## 2018-01-01 DIAGNOSIS — F411 Generalized anxiety disorder: Secondary | ICD-10-CM | POA: Diagnosis not present

## 2018-01-01 DIAGNOSIS — F331 Major depressive disorder, recurrent, moderate: Secondary | ICD-10-CM | POA: Diagnosis not present

## 2018-01-09 DIAGNOSIS — F321 Major depressive disorder, single episode, moderate: Secondary | ICD-10-CM | POA: Diagnosis not present

## 2018-01-18 DIAGNOSIS — F411 Generalized anxiety disorder: Secondary | ICD-10-CM | POA: Diagnosis not present

## 2018-01-18 DIAGNOSIS — F331 Major depressive disorder, recurrent, moderate: Secondary | ICD-10-CM | POA: Diagnosis not present

## 2018-02-12 DIAGNOSIS — F331 Major depressive disorder, recurrent, moderate: Secondary | ICD-10-CM | POA: Diagnosis not present

## 2018-02-12 DIAGNOSIS — F411 Generalized anxiety disorder: Secondary | ICD-10-CM | POA: Diagnosis not present

## 2018-02-19 DIAGNOSIS — F331 Major depressive disorder, recurrent, moderate: Secondary | ICD-10-CM | POA: Diagnosis not present

## 2018-02-19 DIAGNOSIS — F411 Generalized anxiety disorder: Secondary | ICD-10-CM | POA: Diagnosis not present

## 2018-03-05 DIAGNOSIS — F331 Major depressive disorder, recurrent, moderate: Secondary | ICD-10-CM | POA: Diagnosis not present

## 2018-03-05 DIAGNOSIS — F411 Generalized anxiety disorder: Secondary | ICD-10-CM | POA: Diagnosis not present

## 2018-03-19 DIAGNOSIS — F331 Major depressive disorder, recurrent, moderate: Secondary | ICD-10-CM | POA: Diagnosis not present

## 2018-03-19 DIAGNOSIS — F411 Generalized anxiety disorder: Secondary | ICD-10-CM | POA: Diagnosis not present

## 2018-04-04 DIAGNOSIS — F411 Generalized anxiety disorder: Secondary | ICD-10-CM | POA: Diagnosis not present

## 2018-04-04 DIAGNOSIS — F331 Major depressive disorder, recurrent, moderate: Secondary | ICD-10-CM | POA: Diagnosis not present

## 2018-04-09 DIAGNOSIS — F321 Major depressive disorder, single episode, moderate: Secondary | ICD-10-CM | POA: Diagnosis not present

## 2018-04-18 DIAGNOSIS — F411 Generalized anxiety disorder: Secondary | ICD-10-CM | POA: Diagnosis not present

## 2018-04-18 DIAGNOSIS — F331 Major depressive disorder, recurrent, moderate: Secondary | ICD-10-CM | POA: Diagnosis not present

## 2018-04-30 DIAGNOSIS — F331 Major depressive disorder, recurrent, moderate: Secondary | ICD-10-CM | POA: Diagnosis not present

## 2018-04-30 DIAGNOSIS — F411 Generalized anxiety disorder: Secondary | ICD-10-CM | POA: Diagnosis not present

## 2018-06-04 DIAGNOSIS — F411 Generalized anxiety disorder: Secondary | ICD-10-CM | POA: Diagnosis not present

## 2018-06-04 DIAGNOSIS — F331 Major depressive disorder, recurrent, moderate: Secondary | ICD-10-CM | POA: Diagnosis not present

## 2018-06-18 DIAGNOSIS — F331 Major depressive disorder, recurrent, moderate: Secondary | ICD-10-CM | POA: Diagnosis not present

## 2018-06-18 DIAGNOSIS — F411 Generalized anxiety disorder: Secondary | ICD-10-CM | POA: Diagnosis not present

## 2018-07-10 DIAGNOSIS — F331 Major depressive disorder, recurrent, moderate: Secondary | ICD-10-CM | POA: Diagnosis not present

## 2018-07-10 DIAGNOSIS — F411 Generalized anxiety disorder: Secondary | ICD-10-CM | POA: Diagnosis not present

## 2018-07-26 DIAGNOSIS — F321 Major depressive disorder, single episode, moderate: Secondary | ICD-10-CM | POA: Diagnosis not present

## 2018-07-26 DIAGNOSIS — F411 Generalized anxiety disorder: Secondary | ICD-10-CM | POA: Diagnosis not present

## 2018-07-26 DIAGNOSIS — F331 Major depressive disorder, recurrent, moderate: Secondary | ICD-10-CM | POA: Diagnosis not present

## 2018-08-08 DIAGNOSIS — F331 Major depressive disorder, recurrent, moderate: Secondary | ICD-10-CM | POA: Diagnosis not present

## 2018-08-08 DIAGNOSIS — F411 Generalized anxiety disorder: Secondary | ICD-10-CM | POA: Diagnosis not present

## 2018-08-22 DIAGNOSIS — F411 Generalized anxiety disorder: Secondary | ICD-10-CM | POA: Diagnosis not present

## 2018-08-22 DIAGNOSIS — F331 Major depressive disorder, recurrent, moderate: Secondary | ICD-10-CM | POA: Diagnosis not present

## 2018-09-03 DIAGNOSIS — F331 Major depressive disorder, recurrent, moderate: Secondary | ICD-10-CM | POA: Diagnosis not present

## 2018-09-03 DIAGNOSIS — F411 Generalized anxiety disorder: Secondary | ICD-10-CM | POA: Diagnosis not present

## 2018-09-17 DIAGNOSIS — F411 Generalized anxiety disorder: Secondary | ICD-10-CM | POA: Diagnosis not present

## 2018-09-17 DIAGNOSIS — F331 Major depressive disorder, recurrent, moderate: Secondary | ICD-10-CM | POA: Diagnosis not present

## 2018-10-01 DIAGNOSIS — F331 Major depressive disorder, recurrent, moderate: Secondary | ICD-10-CM | POA: Diagnosis not present

## 2018-10-01 DIAGNOSIS — F411 Generalized anxiety disorder: Secondary | ICD-10-CM | POA: Diagnosis not present

## 2018-10-22 DIAGNOSIS — F411 Generalized anxiety disorder: Secondary | ICD-10-CM | POA: Diagnosis not present

## 2018-10-22 DIAGNOSIS — F321 Major depressive disorder, single episode, moderate: Secondary | ICD-10-CM | POA: Diagnosis not present

## 2018-10-22 DIAGNOSIS — F331 Major depressive disorder, recurrent, moderate: Secondary | ICD-10-CM | POA: Diagnosis not present

## 2018-11-12 DIAGNOSIS — F331 Major depressive disorder, recurrent, moderate: Secondary | ICD-10-CM | POA: Diagnosis not present

## 2018-11-12 DIAGNOSIS — F411 Generalized anxiety disorder: Secondary | ICD-10-CM | POA: Diagnosis not present

## 2018-12-03 DIAGNOSIS — F331 Major depressive disorder, recurrent, moderate: Secondary | ICD-10-CM | POA: Diagnosis not present

## 2018-12-03 DIAGNOSIS — F411 Generalized anxiety disorder: Secondary | ICD-10-CM | POA: Diagnosis not present

## 2018-12-31 DIAGNOSIS — F411 Generalized anxiety disorder: Secondary | ICD-10-CM | POA: Diagnosis not present

## 2018-12-31 DIAGNOSIS — F331 Major depressive disorder, recurrent, moderate: Secondary | ICD-10-CM | POA: Diagnosis not present

## 2019-01-18 DIAGNOSIS — F321 Major depressive disorder, single episode, moderate: Secondary | ICD-10-CM | POA: Diagnosis not present

## 2019-01-23 DIAGNOSIS — F331 Major depressive disorder, recurrent, moderate: Secondary | ICD-10-CM | POA: Diagnosis not present

## 2019-01-23 DIAGNOSIS — F411 Generalized anxiety disorder: Secondary | ICD-10-CM | POA: Diagnosis not present

## 2019-02-13 DIAGNOSIS — F331 Major depressive disorder, recurrent, moderate: Secondary | ICD-10-CM | POA: Diagnosis not present

## 2019-02-13 DIAGNOSIS — F411 Generalized anxiety disorder: Secondary | ICD-10-CM | POA: Diagnosis not present

## 2019-02-15 DIAGNOSIS — K130 Diseases of lips: Secondary | ICD-10-CM | POA: Diagnosis not present

## 2019-03-06 DIAGNOSIS — F331 Major depressive disorder, recurrent, moderate: Secondary | ICD-10-CM | POA: Diagnosis not present

## 2019-03-06 DIAGNOSIS — F411 Generalized anxiety disorder: Secondary | ICD-10-CM | POA: Diagnosis not present

## 2019-03-27 DIAGNOSIS — F411 Generalized anxiety disorder: Secondary | ICD-10-CM | POA: Diagnosis not present

## 2019-03-27 DIAGNOSIS — F331 Major depressive disorder, recurrent, moderate: Secondary | ICD-10-CM | POA: Diagnosis not present

## 2019-04-12 DIAGNOSIS — Z20828 Contact with and (suspected) exposure to other viral communicable diseases: Secondary | ICD-10-CM | POA: Diagnosis not present

## 2019-04-17 DIAGNOSIS — F411 Generalized anxiety disorder: Secondary | ICD-10-CM | POA: Diagnosis not present

## 2019-04-17 DIAGNOSIS — F331 Major depressive disorder, recurrent, moderate: Secondary | ICD-10-CM | POA: Diagnosis not present

## 2019-04-18 DIAGNOSIS — F321 Major depressive disorder, single episode, moderate: Secondary | ICD-10-CM | POA: Diagnosis not present

## 2019-05-08 DIAGNOSIS — F331 Major depressive disorder, recurrent, moderate: Secondary | ICD-10-CM | POA: Diagnosis not present

## 2019-05-08 DIAGNOSIS — F411 Generalized anxiety disorder: Secondary | ICD-10-CM | POA: Diagnosis not present

## 2019-05-29 DIAGNOSIS — F411 Generalized anxiety disorder: Secondary | ICD-10-CM | POA: Diagnosis not present

## 2019-05-29 DIAGNOSIS — F331 Major depressive disorder, recurrent, moderate: Secondary | ICD-10-CM | POA: Diagnosis not present

## 2019-06-19 DIAGNOSIS — F411 Generalized anxiety disorder: Secondary | ICD-10-CM | POA: Diagnosis not present

## 2019-06-19 DIAGNOSIS — F331 Major depressive disorder, recurrent, moderate: Secondary | ICD-10-CM | POA: Diagnosis not present

## 2019-07-08 DIAGNOSIS — F331 Major depressive disorder, recurrent, moderate: Secondary | ICD-10-CM | POA: Diagnosis not present

## 2019-07-08 DIAGNOSIS — F411 Generalized anxiety disorder: Secondary | ICD-10-CM | POA: Diagnosis not present

## 2019-07-12 DIAGNOSIS — F321 Major depressive disorder, single episode, moderate: Secondary | ICD-10-CM | POA: Diagnosis not present

## 2019-07-29 DIAGNOSIS — F331 Major depressive disorder, recurrent, moderate: Secondary | ICD-10-CM | POA: Diagnosis not present

## 2019-07-29 DIAGNOSIS — F411 Generalized anxiety disorder: Secondary | ICD-10-CM | POA: Diagnosis not present

## 2019-08-19 DIAGNOSIS — F331 Major depressive disorder, recurrent, moderate: Secondary | ICD-10-CM | POA: Diagnosis not present

## 2019-08-19 DIAGNOSIS — F411 Generalized anxiety disorder: Secondary | ICD-10-CM | POA: Diagnosis not present

## 2019-09-09 DIAGNOSIS — F411 Generalized anxiety disorder: Secondary | ICD-10-CM | POA: Diagnosis not present

## 2019-09-09 DIAGNOSIS — F331 Major depressive disorder, recurrent, moderate: Secondary | ICD-10-CM | POA: Diagnosis not present

## 2019-09-30 DIAGNOSIS — F331 Major depressive disorder, recurrent, moderate: Secondary | ICD-10-CM | POA: Diagnosis not present

## 2019-09-30 DIAGNOSIS — F411 Generalized anxiety disorder: Secondary | ICD-10-CM | POA: Diagnosis not present

## 2019-10-07 DIAGNOSIS — F321 Major depressive disorder, single episode, moderate: Secondary | ICD-10-CM | POA: Diagnosis not present

## 2019-10-17 DIAGNOSIS — Z0000 Encounter for general adult medical examination without abnormal findings: Secondary | ICD-10-CM | POA: Diagnosis not present

## 2019-10-17 DIAGNOSIS — Z125 Encounter for screening for malignant neoplasm of prostate: Secondary | ICD-10-CM | POA: Diagnosis not present

## 2019-10-17 DIAGNOSIS — Z13220 Encounter for screening for lipoid disorders: Secondary | ICD-10-CM | POA: Diagnosis not present

## 2019-10-17 DIAGNOSIS — R5383 Other fatigue: Secondary | ICD-10-CM | POA: Diagnosis not present

## 2019-10-21 DIAGNOSIS — F411 Generalized anxiety disorder: Secondary | ICD-10-CM | POA: Diagnosis not present

## 2019-10-21 DIAGNOSIS — F331 Major depressive disorder, recurrent, moderate: Secondary | ICD-10-CM | POA: Diagnosis not present

## 2019-11-11 DIAGNOSIS — F411 Generalized anxiety disorder: Secondary | ICD-10-CM | POA: Diagnosis not present

## 2019-11-11 DIAGNOSIS — F331 Major depressive disorder, recurrent, moderate: Secondary | ICD-10-CM | POA: Diagnosis not present

## 2019-11-18 DIAGNOSIS — K5900 Constipation, unspecified: Secondary | ICD-10-CM | POA: Diagnosis not present

## 2019-11-18 DIAGNOSIS — R109 Unspecified abdominal pain: Secondary | ICD-10-CM | POA: Diagnosis not present

## 2019-11-18 DIAGNOSIS — K639 Disease of intestine, unspecified: Secondary | ICD-10-CM | POA: Diagnosis not present

## 2019-11-18 DIAGNOSIS — R197 Diarrhea, unspecified: Secondary | ICD-10-CM | POA: Diagnosis not present

## 2019-11-19 DIAGNOSIS — R197 Diarrhea, unspecified: Secondary | ICD-10-CM | POA: Diagnosis not present

## 2019-12-02 DIAGNOSIS — F411 Generalized anxiety disorder: Secondary | ICD-10-CM | POA: Diagnosis not present

## 2019-12-02 DIAGNOSIS — F331 Major depressive disorder, recurrent, moderate: Secondary | ICD-10-CM | POA: Diagnosis not present

## 2019-12-23 DIAGNOSIS — E559 Vitamin D deficiency, unspecified: Secondary | ICD-10-CM | POA: Diagnosis not present

## 2019-12-23 DIAGNOSIS — F411 Generalized anxiety disorder: Secondary | ICD-10-CM | POA: Diagnosis not present

## 2019-12-23 DIAGNOSIS — F331 Major depressive disorder, recurrent, moderate: Secondary | ICD-10-CM | POA: Diagnosis not present

## 2020-01-01 DIAGNOSIS — F321 Major depressive disorder, single episode, moderate: Secondary | ICD-10-CM | POA: Diagnosis not present

## 2020-01-20 DIAGNOSIS — K639 Disease of intestine, unspecified: Secondary | ICD-10-CM | POA: Diagnosis not present

## 2020-01-20 DIAGNOSIS — Z1211 Encounter for screening for malignant neoplasm of colon: Secondary | ICD-10-CM | POA: Diagnosis not present

## 2020-01-23 DIAGNOSIS — Z1211 Encounter for screening for malignant neoplasm of colon: Secondary | ICD-10-CM | POA: Diagnosis not present

## 2020-01-28 DIAGNOSIS — Z20822 Contact with and (suspected) exposure to covid-19: Secondary | ICD-10-CM | POA: Diagnosis not present

## 2020-01-28 DIAGNOSIS — Z03818 Encounter for observation for suspected exposure to other biological agents ruled out: Secondary | ICD-10-CM | POA: Diagnosis not present

## 2020-02-12 DIAGNOSIS — F411 Generalized anxiety disorder: Secondary | ICD-10-CM | POA: Diagnosis not present

## 2020-02-12 DIAGNOSIS — F331 Major depressive disorder, recurrent, moderate: Secondary | ICD-10-CM | POA: Diagnosis not present

## 2020-03-04 DIAGNOSIS — F411 Generalized anxiety disorder: Secondary | ICD-10-CM | POA: Diagnosis not present

## 2020-03-04 DIAGNOSIS — F331 Major depressive disorder, recurrent, moderate: Secondary | ICD-10-CM | POA: Diagnosis not present

## 2020-03-25 DIAGNOSIS — F331 Major depressive disorder, recurrent, moderate: Secondary | ICD-10-CM | POA: Diagnosis not present

## 2020-03-25 DIAGNOSIS — F411 Generalized anxiety disorder: Secondary | ICD-10-CM | POA: Diagnosis not present

## 2020-04-15 DIAGNOSIS — F331 Major depressive disorder, recurrent, moderate: Secondary | ICD-10-CM | POA: Diagnosis not present

## 2020-04-15 DIAGNOSIS — F411 Generalized anxiety disorder: Secondary | ICD-10-CM | POA: Diagnosis not present

## 2020-05-06 DIAGNOSIS — F411 Generalized anxiety disorder: Secondary | ICD-10-CM | POA: Diagnosis not present

## 2020-05-06 DIAGNOSIS — F331 Major depressive disorder, recurrent, moderate: Secondary | ICD-10-CM | POA: Diagnosis not present

## 2020-05-27 DIAGNOSIS — F411 Generalized anxiety disorder: Secondary | ICD-10-CM | POA: Diagnosis not present

## 2020-05-27 DIAGNOSIS — F331 Major depressive disorder, recurrent, moderate: Secondary | ICD-10-CM | POA: Diagnosis not present

## 2020-06-17 DIAGNOSIS — F411 Generalized anxiety disorder: Secondary | ICD-10-CM | POA: Diagnosis not present

## 2020-06-17 DIAGNOSIS — F331 Major depressive disorder, recurrent, moderate: Secondary | ICD-10-CM | POA: Diagnosis not present

## 2020-07-08 DIAGNOSIS — F411 Generalized anxiety disorder: Secondary | ICD-10-CM | POA: Diagnosis not present

## 2020-07-08 DIAGNOSIS — F331 Major depressive disorder, recurrent, moderate: Secondary | ICD-10-CM | POA: Diagnosis not present

## 2020-07-29 DIAGNOSIS — F411 Generalized anxiety disorder: Secondary | ICD-10-CM | POA: Diagnosis not present

## 2020-07-29 DIAGNOSIS — F331 Major depressive disorder, recurrent, moderate: Secondary | ICD-10-CM | POA: Diagnosis not present

## 2020-11-11 DIAGNOSIS — F331 Major depressive disorder, recurrent, moderate: Secondary | ICD-10-CM | POA: Diagnosis not present

## 2020-11-11 DIAGNOSIS — F411 Generalized anxiety disorder: Secondary | ICD-10-CM | POA: Diagnosis not present

## 2020-12-09 DIAGNOSIS — F411 Generalized anxiety disorder: Secondary | ICD-10-CM | POA: Diagnosis not present

## 2020-12-09 DIAGNOSIS — F331 Major depressive disorder, recurrent, moderate: Secondary | ICD-10-CM | POA: Diagnosis not present

## 2020-12-28 ENCOUNTER — Encounter (HOSPITAL_COMMUNITY): Payer: Self-pay | Admitting: Student

## 2020-12-28 ENCOUNTER — Emergency Department (HOSPITAL_COMMUNITY)
Admission: EM | Admit: 2020-12-28 | Discharge: 2020-12-28 | Disposition: A | Discharge status: Home / Self Care | Payer: Federal, State, Local not specified - PPO | Attending: Emergency Medicine | Admitting: Emergency Medicine

## 2020-12-28 ENCOUNTER — Emergency Department (HOSPITAL_COMMUNITY): Payer: Federal, State, Local not specified - PPO

## 2020-12-28 ENCOUNTER — Other Ambulatory Visit: Payer: Self-pay

## 2020-12-28 DIAGNOSIS — U071 COVID-19: Secondary | ICD-10-CM | POA: Diagnosis not present

## 2020-12-28 DIAGNOSIS — R110 Nausea: Secondary | ICD-10-CM | POA: Diagnosis not present

## 2020-12-28 DIAGNOSIS — R55 Syncope and collapse: Secondary | ICD-10-CM | POA: Diagnosis not present

## 2020-12-28 DIAGNOSIS — R197 Diarrhea, unspecified: Secondary | ICD-10-CM | POA: Diagnosis not present

## 2020-12-28 DIAGNOSIS — R0602 Shortness of breath: Secondary | ICD-10-CM | POA: Diagnosis not present

## 2020-12-28 DIAGNOSIS — R509 Fever, unspecified: Secondary | ICD-10-CM | POA: Diagnosis not present

## 2020-12-28 DIAGNOSIS — F17200 Nicotine dependence, unspecified, uncomplicated: Secondary | ICD-10-CM | POA: Insufficient documentation

## 2020-12-28 LAB — CBC WITH DIFFERENTIAL/PLATELET
Abs Immature Granulocytes: 0.01 K/uL (ref 0.00–0.07)
Basophils Absolute: 0 K/uL (ref 0.0–0.1)
Basophils Relative: 1 %
Eosinophils Absolute: 0 K/uL (ref 0.0–0.5)
Eosinophils Relative: 0 %
HCT: 44.9 % (ref 39.0–52.0)
Hemoglobin: 14.3 g/dL (ref 13.0–17.0)
Immature Granulocytes: 0 %
Lymphocytes Relative: 23 %
Lymphs Abs: 0.7 K/uL (ref 0.7–4.0)
MCH: 28.9 pg (ref 26.0–34.0)
MCHC: 31.8 g/dL (ref 30.0–36.0)
MCV: 90.9 fL (ref 80.0–100.0)
Monocytes Absolute: 0.7 K/uL (ref 0.1–1.0)
Monocytes Relative: 20 %
Neutro Abs: 1.8 K/uL (ref 1.7–7.7)
Neutrophils Relative %: 56 %
Platelets: 178 K/uL (ref 150–400)
RBC: 4.94 MIL/uL (ref 4.22–5.81)
RDW: 12.6 % (ref 11.5–15.5)
WBC: 3.2 K/uL — ABNORMAL LOW (ref 4.0–10.5)
nRBC: 0 % (ref 0.0–0.2)

## 2020-12-28 LAB — COMPREHENSIVE METABOLIC PANEL
ALT: 20 U/L (ref 0–44)
AST: 24 U/L (ref 15–41)
Albumin: 4.3 g/dL (ref 3.5–5.0)
Alkaline Phosphatase: 42 U/L (ref 38–126)
Anion gap: 11 (ref 5–15)
BUN: 16 mg/dL (ref 6–20)
CO2: 24 mmol/L (ref 22–32)
Calcium: 8.7 mg/dL — ABNORMAL LOW (ref 8.9–10.3)
Chloride: 102 mmol/L (ref 98–111)
Creatinine, Ser: 1.5 mg/dL — ABNORMAL HIGH (ref 0.61–1.24)
GFR, Estimated: 57 mL/min — ABNORMAL LOW (ref >60)
Glucose, Bld: 113 mg/dL — ABNORMAL HIGH (ref 70–99)
Potassium: 3.8 mmol/L (ref 3.5–5.1)
Sodium: 137 mmol/L (ref 135–145)
Total Bilirubin: 1 mg/dL (ref 0.3–1.2)
Total Protein: 7.3 g/dL (ref 6.5–8.1)

## 2020-12-28 LAB — RESP PANEL BY RT-PCR (FLU A&B, COVID) ARPGX2
Influenza A by PCR: NEGATIVE
Influenza B by PCR: NEGATIVE
SARS Coronavirus 2 by RT PCR: POSITIVE — AB

## 2020-12-28 MED ORDER — SODIUM CHLORIDE 0.9 % IV BOLUS
1000.0000 mL | Freq: Once | INTRAVENOUS | Status: AC
  Administered 2020-12-28: 1000 mL via INTRAVENOUS

## 2020-12-28 MED ORDER — ONDANSETRON HCL 4 MG/2ML IJ SOLN
4.0000 mg | Freq: Once | INTRAMUSCULAR | Status: AC
  Administered 2020-12-28: 4 mg via INTRAVENOUS
  Filled 2020-12-28: qty 2

## 2020-12-28 MED ORDER — NIRMATRELVIR/RITONAVIR (PAXLOVID)TABLET: 3.0000 | ORAL_TABLET | Freq: Two times a day (BID) | ORAL | 0 refills | Status: AC

## 2020-12-28 MED ORDER — ACETAMINOPHEN 500 MG PO TABS
1000.0000 mg | ORAL_TABLET | Freq: Once | ORAL | Status: AC
  Administered 2020-12-28: 1000 mg via ORAL
  Filled 2020-12-28: qty 2

## 2020-12-28 NOTE — ED Notes (Addendum)
Spoke with lab about COVID test, according to lab the test timed out and has to be restarted.

## 2020-12-28 NOTE — ED Provider Notes (Signed)
[facility]-EMERGENCY DEPT Provider Note   CSN: [id number] Arrival date & time: 12/28/20  2585     History Chief Complaint  Patient presents with  . Cough  . Generalized Body Aches  . Loss of Consciousness  . Fever  . Anorexia  . Diarrhea    Derrick Webb is a 50 y.o. male.  Patient presents to the emergency department today for evaluation of fever, body aches, occasional cough, decreased appetite and nausea without vomiting.  Symptoms started on 5/28.  He took ibuprofen at onset, no other treatments.  No known sick contacts.  He has had 1 COVID-vaccine shot.  He denies significant headache, ear pain, sore throat or runny nose.  Patient reports that after getting up from the bathroom yesterday he had a syncopal episode.  He hit his shoulder and elbow when he fell.  He has had some diarrhea.  No urinary symptoms or history of UTI.  No tick bites or skin rashes.  He is not short of breath and is not having any difficulty breathing.  Onset of symptoms acute.  Course is constant.  Nothing made symptoms better or worse.        Past Medical History:  Diagnosis Date  . Palpitations 10/19/2016  . Shortness of breath 10/19/2016  . Sleep apnea 10/19/2016  . Tobacco abuse 10/19/2016    Patient Active Problem List   Diagnosis Date Noted  . Generalized anxiety disorder 09/16/2017  . Major depressive disorder, single episode, severe (HCC) 09/16/2017  . MDD (major depressive disorder), single episode, severe , no psychosis (HCC) 09/16/2017  . Tobacco abuse 10/19/2016  . Sleep apnea 10/19/2016  . Palpitations 10/19/2016  . Shortness of breath 10/19/2016    History reviewed. No pertinent surgical history.     Family History  Problem Relation Age of Onset  . Dementia Mother   . Hypertension Father   . Dementia Maternal Grandmother   . Sleep apnea Brother     Social History   Tobacco Use  . Smoking status: Current Every Day Smoker  . Smokeless tobacco: Never  Used  Substance Use Topics  . Alcohol use: Yes    Comment: occ  . Drug use: No    Home Medications Prior to Admission medications   Not on File    Allergies    Patient has no known allergies.  Review of Systems   Review of Systems  Constitutional: Positive for appetite change, chills, fatigue and fever.  HENT: Negative for rhinorrhea and sore throat.   Eyes: Negative for redness.  Respiratory: Positive for cough. Negative for shortness of breath.   Cardiovascular: Negative for chest pain.  Gastrointestinal: Positive for diarrhea and nausea. Negative for abdominal pain and vomiting.  Genitourinary: Negative for dysuria and hematuria.  Musculoskeletal: Positive for myalgias.  Skin: Negative for rash.  Neurological: Positive for syncope. Negative for headaches.    Physical Exam Updated Vital Signs BP 115/80 (BP Location: Right Arm)   Pulse 89   Temp (!) 102 F (38.9 C) (Oral)   Resp (!) 30   Ht 5\' 8"  (1.727 m)   Wt 93 kg   SpO2 97%   BMI 31.17 kg/m   Physical Exam Vitals and nursing note reviewed.  Constitutional:      Appearance: He is well-developed. He is not diaphoretic.  HENT:     Head: Normocephalic and atraumatic.     Right Ear: Tympanic membrane, ear canal and external ear normal.     Left  Ear: Tympanic membrane, ear canal and external ear normal.     Nose: Nose normal. No congestion.     Mouth/Throat:     Mouth: Mucous membranes are moist. Mucous membranes are not dry.     Pharynx: No oropharyngeal exudate or posterior oropharyngeal erythema.  Eyes:     Conjunctiva/sclera: Conjunctivae normal.  Neck:     Vascular: Normal carotid pulses. No carotid bruit or JVD.     Trachea: Trachea normal. No tracheal deviation.  Cardiovascular:     Rate and Rhythm: Normal rate and regular rhythm.     Pulses: No decreased pulses.     Heart sounds: Normal heart sounds, S1 normal and S2 normal. Heart sounds not distant. No murmur heard.   Pulmonary:     Effort:  Pulmonary effort is normal. No respiratory distress.     Breath sounds: Normal breath sounds. No wheezing.  Chest:     Chest wall: No tenderness.  Abdominal:     General: Bowel sounds are normal.     Palpations: Abdomen is soft.     Tenderness: There is no abdominal tenderness. There is no guarding or rebound.  Musculoskeletal:     Cervical back: Normal range of motion and neck supple. No muscular tenderness.  Skin:    General: Skin is warm and dry.     Coloration: Skin is not pale.  Neurological:     Mental Status: He is alert.     ED Results / Procedures / Treatments   Labs (all labs ordered are listed, but only abnormal results are displayed) Labs Reviewed  RESP PANEL BY RT-PCR (FLU A&B, COVID) ARPGX2 - Abnormal; Notable for the following components:      Result Value   SARS Coronavirus 2 by RT PCR POSITIVE (*)    All other components within normal limits  COMPREHENSIVE METABOLIC PANEL - Abnormal; Notable for the following components:   Glucose, Bld 113 (*)    Creatinine, Ser 1.50 (*)    Calcium 8.7 (*)    GFR, Estimated 57 (*)    All other components within normal limits  CBC WITH DIFFERENTIAL/PLATELET - Abnormal; Notable for the following components:   WBC 3.2 (*)    All other components within normal limits    EKG None  Radiology DG Chest Port 1 View  Result Date: 12/28/2020 CLINICAL DATA:  50 year old male with history of cough, shortness of breath and syncope yesterday. EXAM: PORTABLE CHEST 1 VIEW COMPARISON:  Chest x-ray 09/29/2016. FINDINGS: Lung volumes are normal. No consolidative airspace disease. No pleural effusions. No pneumothorax. No pulmonary nodule or mass noted. Pulmonary vasculature and the cardiomediastinal silhouette are within normal limits. IMPRESSION: No radiographic evidence of acute cardiopulmonary disease. Electronically Signed   By: Trudie Reed M.D.   On: 12/28/2020 08:30    Procedures Procedures   Medications Ordered in  ED Medications  acetaminophen (TYLENOL) tablet 1,000 mg (1,000 mg Oral Given 12/28/20 0756)  ondansetron (ZOFRAN) injection 4 mg (4 mg Intravenous Given 12/28/20 0756)  sodium chloride 0.9 % bolus 1,000 mL (0 mLs Intravenous Stopped 12/28/20 1050)  sodium chloride 0.9 % bolus 1,000 mL (0 mLs Intravenous Stopped 12/28/20 1001)    ED Course  I have reviewed the triage vital signs and the nursing notes.  Pertinent labs & imaging results that were available during my care of the patient were reviewed by me and considered in my medical decision making (see chart for details).  Patient seen and examined. Work-up initiated. Medications ordered.  Will check labs, orthostatics, hydrate given reported syncopal episode yesterday.  Suspect viral illness or COVID.  Vital signs reviewed and are as follows: BP 115/80 (BP Location: Right Arm)   Pulse 89   Temp (!) 102 F (38.9 C) (Oral)   Resp (!) 30   Ht 5\' 8"  (1.727 m)   Wt 93 kg   SpO2 97%   BMI 31.17 kg/m   11:31 AM Delay in lab over COVID results.  RN reswabbed and recent COVID test.  Awaiting results.  BP 131/72   Pulse 87   Temp 99.2 F (37.3 C) (Oral)   Resp (!) 21   Ht 5\' 8"  (1.727 m)   Wt 93 kg   SpO2 99%   BMI 31.17 kg/m   12:41 PM COVID test is positive.  Patient updated on results.  He states that he is feeling better after IV fluids.  Temperature is down.  We discussed risks and benefits of Paxlovid.  He is interested.  Prescription written.  Detailed discussion had with with patient regarding COVID-19 precautions and written instructions given as well.  We discussed need to isolate themselves for 5 days from onset of symptoms and have 24 hours of improvement prior to breaking isolation.  We discussed that when breaking isolation, mask wearing for 5 additional days is required.  We discussed signs symptoms to return which include worsening shortness of breath, trouble breathing, or increased work of breathing.  Also return with  persistent vomiting, confusion, passing out, or if they have any other concerns. Counseled on the need for rest and good hydration. Discussed that high-risk contacts should be aware of positive result and they need to quarantine and be tested if they develop any symptoms. Patient verbalizes understanding.   JAYCEION LISENBY was evaluated in Emergency Department on 12/28/2020 for the symptoms described in the history of present illness. He was evaluated in the context of the global COVID-19 pandemic, which necessitated consideration that the patient might be at risk for infection with the SARS-CoV-2 virus that causes COVID-19. Institutional protocols and algorithms that pertain to the evaluation of patients at risk for COVID-19 are in a state of rapid change based on information released by regulatory bodies including the CDC and federal and state organizations. These policies and algorithms were followed during the patient's care in the ED.     MDM Rules/Calculators/A&P                          Patient with COVID-19 symptoms, doubt pneumonia.  BMI > 30 and he is interested in antiviral treatment.  He appears well, nontoxic.  He is feeling better after treatment here.  Plan for discharge.   Final Clinical Impression(s) / ED Diagnoses Final diagnoses:  COVID-19    Rx / DC Orders ED Discharge Orders         Ordered    nirmatrelvir/ritonavir EUA (PAXLOVID) TABS  2 times daily        12/28/20 1238           12/30/2020, PA-C 12/28/20 1242    Renne Crigler, MD 01/01/21 [phone]

## 2020-12-28 NOTE — ED Notes (Signed)
Pt states he began to feel " hot and dizzy" after standing less than a minute. Stated " felt like this yesterday before I passed out". RN notified.

## 2020-12-28 NOTE — ED Triage Notes (Signed)
Patient BIB POV c/o body aches, fevers, cough, and loss of appetite for the last 3 days.  Patient also passed out last night when coming back from the bathroom.  Hit his shoulder and elbow, denies hitting his head.  Patient is vaccinated x 1 shot.  No recent covid test.

## 2020-12-28 NOTE — ED Notes (Signed)
Spoke with lab about COVID test again, according to lab they had to QC their machine and it will be about 30-40 more minutes.

## 2020-12-28 NOTE — Discharge Instructions (Signed)
Please read and follow all provided instructions.  Your diagnoses today include:  1. COVID-19     Tests performed today include:  Vital signs. See below for your results today.   COVID test - was positive for covid infection Blood cell counts (white, red, and platelets) Electrolytes  Kidney function test - slightly weak kidney function, please have this rechecked by your doctor after you recover from COVID  Medications prescribed:   Paxlovid - antiviral medication for covid-19  Take any prescribed medications only as directed. Treatment for your infection is aimed at treating the symptoms. There are no medications, such as antibiotics, that will cure your infection.   Home care instructions:  Follow any educational materials contained in this packet.   Your illness is contagious and can be spread to others, especially during the first 3 or 4 days. It cannot be cured by antibiotics or other medicines. Take basic precautions such as washing your hands often, covering your mouth when you cough or sneeze, and avoiding public places where you could spread your illness to others.   Please continue drinking plenty of fluids.  Use over-the-counter medicines as needed as directed on packaging for symptom relief.  You may also use ibuprofen or tylenol as directed on packaging for pain or fever.  Do not take multiple medicines containing Tylenol or acetaminophen to avoid taking too much of this medication.  If you are positive for Covid-19, you should isolate yourself and not be exposed to other people for 5 days after your symptoms began. If you are not feeling better at day 5, you need to isolate yourself for a total of 10 days. If you are feeling better by day 5, you should wear a mask properly, over your nose and mouth, at all times while around other people until 10 days after your symptoms started.   Follow-up instructions: Please follow-up with your primary care provider as needed for  further evaluation of your symptoms if you are not feeling better.   Return instructions:   Please return to the Emergency Department if you experience worsening symptoms.   Return to the emergency department if you have worsening shortness of breath breathing or increased work of breathing, persistent vomiting  RETURN IMMEDIATELY IF you develop shortness of breath, confusion or altered mental status, a new rash, become dizzy, faint, or poorly responsive, or are unable to be cared for at home.  Please return if you have persistent vomiting and cannot keep down fluids or develop a fever that is not controlled by tylenol or motrin.    Please return if you have any other emergent concerns.  Additional Information:  Your vital signs today were: BP 132/89   Pulse 75   Temp 99.2 F (37.3 C) (Oral)   Resp (!) 24   Ht 5\' 8"  (1.727 m)   Wt 93 kg   SpO2 99%   BMI 31.17 kg/m  If your blood pressure (BP) was elevated above 135/85 this visit, please have this repeated by your doctor within one month. --------------

## 2020-12-28 NOTE — ED Notes (Signed)
Patient's COVID test keeps errorring out in the lab.  Patient swabbed again and new swabbed walked to the lab by Clinical research associate.

## 2020-12-30 DIAGNOSIS — F411 Generalized anxiety disorder: Secondary | ICD-10-CM | POA: Diagnosis not present

## 2020-12-30 DIAGNOSIS — F331 Major depressive disorder, recurrent, moderate: Secondary | ICD-10-CM | POA: Diagnosis not present

## 2021-01-27 DIAGNOSIS — F331 Major depressive disorder, recurrent, moderate: Secondary | ICD-10-CM | POA: Diagnosis not present

## 2021-01-27 DIAGNOSIS — F411 Generalized anxiety disorder: Secondary | ICD-10-CM | POA: Diagnosis not present

## 2021-02-12 DIAGNOSIS — E559 Vitamin D deficiency, unspecified: Secondary | ICD-10-CM | POA: Diagnosis not present

## 2021-02-12 DIAGNOSIS — Z13220 Encounter for screening for lipoid disorders: Secondary | ICD-10-CM | POA: Diagnosis not present

## 2021-02-12 DIAGNOSIS — Z125 Encounter for screening for malignant neoplasm of prostate: Secondary | ICD-10-CM | POA: Diagnosis not present

## 2021-02-12 DIAGNOSIS — Z0000 Encounter for general adult medical examination without abnormal findings: Secondary | ICD-10-CM | POA: Diagnosis not present

## 2021-02-24 DIAGNOSIS — F331 Major depressive disorder, recurrent, moderate: Secondary | ICD-10-CM | POA: Diagnosis not present

## 2021-02-24 DIAGNOSIS — F411 Generalized anxiety disorder: Secondary | ICD-10-CM | POA: Diagnosis not present

## 2021-03-17 DIAGNOSIS — F411 Generalized anxiety disorder: Secondary | ICD-10-CM | POA: Diagnosis not present

## 2021-03-17 DIAGNOSIS — F331 Major depressive disorder, recurrent, moderate: Secondary | ICD-10-CM | POA: Diagnosis not present

## 2021-03-31 DIAGNOSIS — F411 Generalized anxiety disorder: Secondary | ICD-10-CM | POA: Diagnosis not present

## 2021-03-31 DIAGNOSIS — F331 Major depressive disorder, recurrent, moderate: Secondary | ICD-10-CM | POA: Diagnosis not present

## 2021-04-28 DIAGNOSIS — F331 Major depressive disorder, recurrent, moderate: Secondary | ICD-10-CM | POA: Diagnosis not present

## 2021-04-28 DIAGNOSIS — F411 Generalized anxiety disorder: Secondary | ICD-10-CM | POA: Diagnosis not present

## 2021-05-19 DIAGNOSIS — F411 Generalized anxiety disorder: Secondary | ICD-10-CM | POA: Diagnosis not present

## 2021-05-19 DIAGNOSIS — F331 Major depressive disorder, recurrent, moderate: Secondary | ICD-10-CM | POA: Diagnosis not present

## 2021-06-09 DIAGNOSIS — F331 Major depressive disorder, recurrent, moderate: Secondary | ICD-10-CM | POA: Diagnosis not present

## 2021-06-09 DIAGNOSIS — F411 Generalized anxiety disorder: Secondary | ICD-10-CM | POA: Diagnosis not present

## 2021-06-30 DIAGNOSIS — F411 Generalized anxiety disorder: Secondary | ICD-10-CM | POA: Diagnosis not present

## 2021-06-30 DIAGNOSIS — F331 Major depressive disorder, recurrent, moderate: Secondary | ICD-10-CM | POA: Diagnosis not present

## 2021-08-11 DIAGNOSIS — F411 Generalized anxiety disorder: Secondary | ICD-10-CM | POA: Diagnosis not present

## 2021-08-11 DIAGNOSIS — F331 Major depressive disorder, recurrent, moderate: Secondary | ICD-10-CM | POA: Diagnosis not present

## 2021-09-01 DIAGNOSIS — F331 Major depressive disorder, recurrent, moderate: Secondary | ICD-10-CM | POA: Diagnosis not present

## 2021-09-01 DIAGNOSIS — F411 Generalized anxiety disorder: Secondary | ICD-10-CM | POA: Diagnosis not present

## 2021-09-22 DIAGNOSIS — F411 Generalized anxiety disorder: Secondary | ICD-10-CM | POA: Diagnosis not present

## 2021-09-22 DIAGNOSIS — F331 Major depressive disorder, recurrent, moderate: Secondary | ICD-10-CM | POA: Diagnosis not present

## 2021-10-14 DIAGNOSIS — R1013 Epigastric pain: Secondary | ICD-10-CM | POA: Diagnosis not present

## 2021-10-20 DIAGNOSIS — F411 Generalized anxiety disorder: Secondary | ICD-10-CM | POA: Diagnosis not present

## 2021-10-20 DIAGNOSIS — F331 Major depressive disorder, recurrent, moderate: Secondary | ICD-10-CM | POA: Diagnosis not present

## 2021-11-10 DIAGNOSIS — F331 Major depressive disorder, recurrent, moderate: Secondary | ICD-10-CM | POA: Diagnosis not present

## 2021-11-10 DIAGNOSIS — F411 Generalized anxiety disorder: Secondary | ICD-10-CM | POA: Diagnosis not present

## 2021-12-22 DIAGNOSIS — F411 Generalized anxiety disorder: Secondary | ICD-10-CM | POA: Diagnosis not present

## 2021-12-22 DIAGNOSIS — F331 Major depressive disorder, recurrent, moderate: Secondary | ICD-10-CM | POA: Diagnosis not present

## 2022-01-11 IMAGING — DX DG CHEST 1V PORT
1 series · 1 of 1 positions shown · non-contrast
Comparison: Chest x-ray 09/29/2016.

CLINICAL DATA: 49-year-old male with history of cough, shortness of
breath and syncope yesterday.

EXAM:
PORTABLE CHEST 1 VIEW

[chest ap]
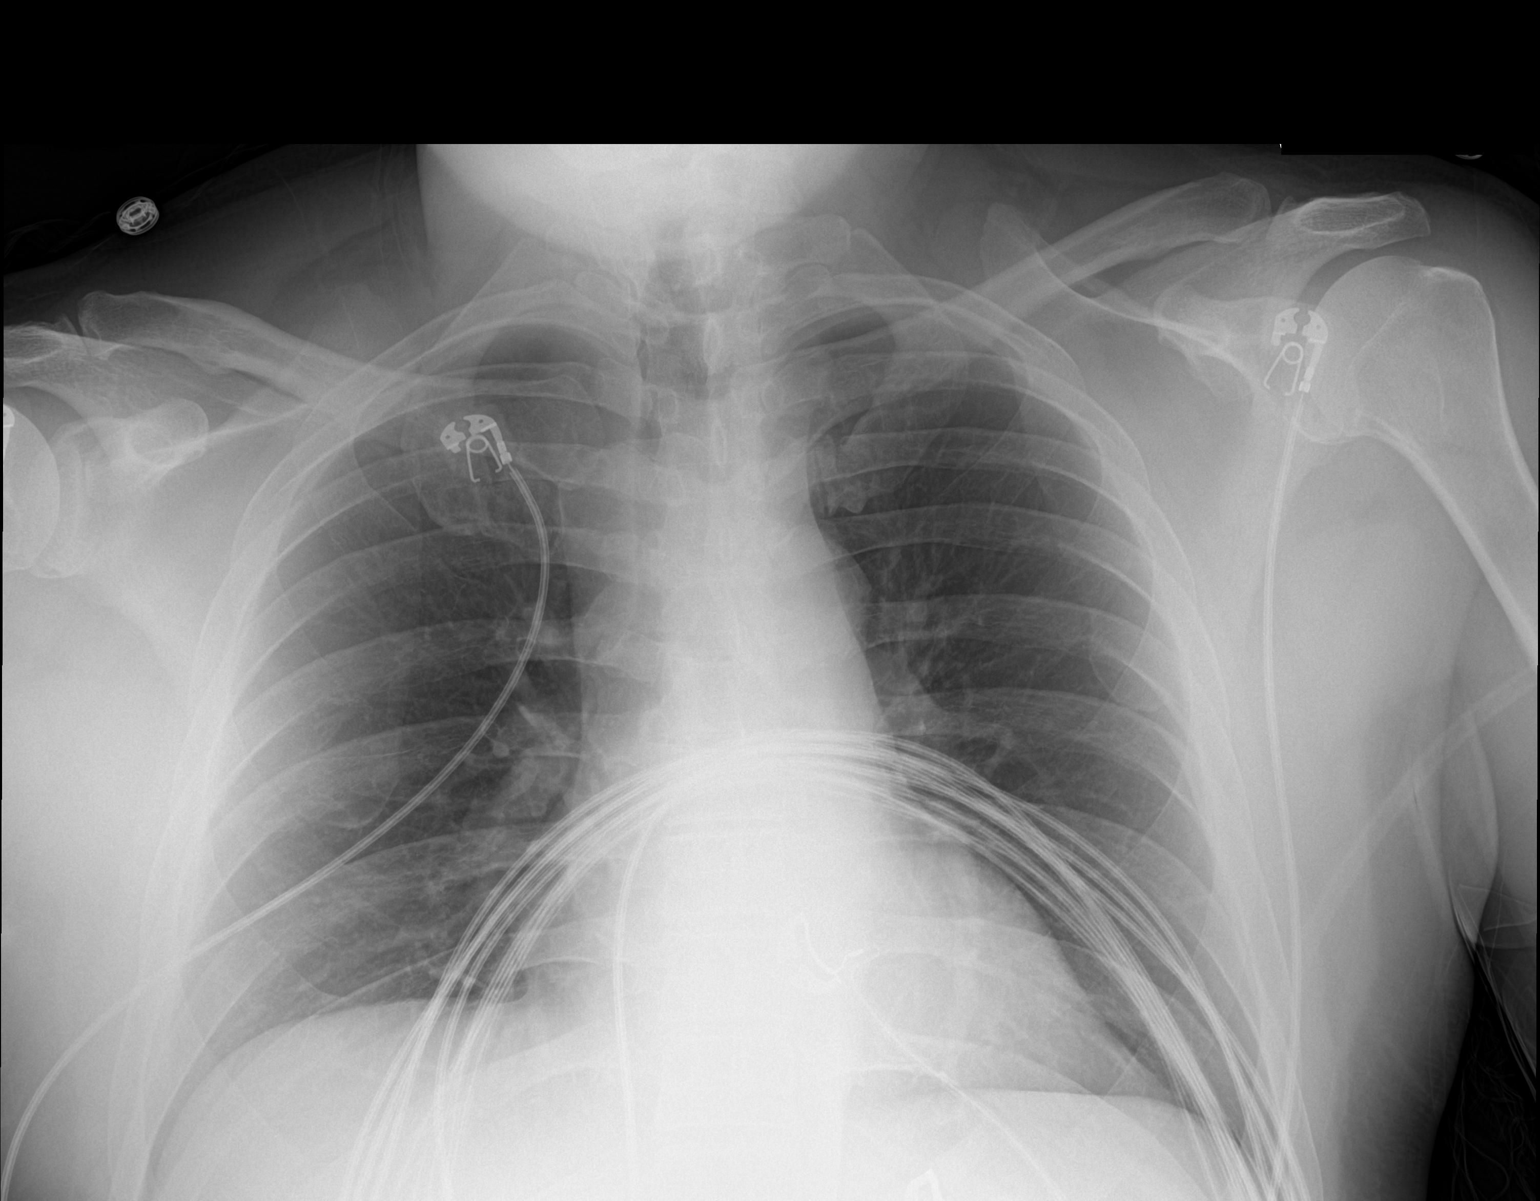

[1 of 1 positions shown; findings below may reference images not displayed]

FINDINGS: Lung volumes are normal. No consolidative airspace disease. No
pleural effusions. No pneumothorax. No pulmonary nodule or mass
noted. Pulmonary vasculature and the cardiomediastinal silhouette
are within normal limits.
IMPRESSION: No radiographic evidence of acute cardiopulmonary disease.

## 2022-01-12 DIAGNOSIS — F331 Major depressive disorder, recurrent, moderate: Secondary | ICD-10-CM | POA: Diagnosis not present

## 2022-01-12 DIAGNOSIS — F411 Generalized anxiety disorder: Secondary | ICD-10-CM | POA: Diagnosis not present

## 2022-02-02 DIAGNOSIS — F411 Generalized anxiety disorder: Secondary | ICD-10-CM | POA: Diagnosis not present

## 2022-02-02 DIAGNOSIS — F331 Major depressive disorder, recurrent, moderate: Secondary | ICD-10-CM | POA: Diagnosis not present

## 2022-02-23 DIAGNOSIS — F411 Generalized anxiety disorder: Secondary | ICD-10-CM | POA: Diagnosis not present

## 2022-02-23 DIAGNOSIS — F331 Major depressive disorder, recurrent, moderate: Secondary | ICD-10-CM | POA: Diagnosis not present

## 2022-03-16 DIAGNOSIS — F331 Major depressive disorder, recurrent, moderate: Secondary | ICD-10-CM | POA: Diagnosis not present

## 2022-03-16 DIAGNOSIS — F411 Generalized anxiety disorder: Secondary | ICD-10-CM | POA: Diagnosis not present

## 2022-04-06 DIAGNOSIS — F411 Generalized anxiety disorder: Secondary | ICD-10-CM | POA: Diagnosis not present

## 2022-04-06 DIAGNOSIS — F331 Major depressive disorder, recurrent, moderate: Secondary | ICD-10-CM | POA: Diagnosis not present

## 2022-04-08 DIAGNOSIS — R1013 Epigastric pain: Secondary | ICD-10-CM | POA: Diagnosis not present

## 2022-04-08 DIAGNOSIS — Z13220 Encounter for screening for lipoid disorders: Secondary | ICD-10-CM | POA: Diagnosis not present

## 2022-04-08 DIAGNOSIS — K219 Gastro-esophageal reflux disease without esophagitis: Secondary | ICD-10-CM | POA: Diagnosis not present

## 2022-04-08 DIAGNOSIS — R42 Dizziness and giddiness: Secondary | ICD-10-CM | POA: Diagnosis not present

## 2022-04-08 DIAGNOSIS — Z0000 Encounter for general adult medical examination without abnormal findings: Secondary | ICD-10-CM | POA: Diagnosis not present

## 2022-04-08 DIAGNOSIS — Z125 Encounter for screening for malignant neoplasm of prostate: Secondary | ICD-10-CM | POA: Diagnosis not present

## 2022-04-11 ENCOUNTER — Telehealth: Payer: Self-pay

## 2022-04-11 NOTE — Telephone Encounter (Signed)
Notes scanned to referral 

## 2022-04-27 ENCOUNTER — Encounter: Payer: Self-pay | Admitting: Internal Medicine

## 2022-04-27 ENCOUNTER — Ambulatory Visit: Payer: Federal, State, Local not specified - PPO | Attending: Internal Medicine | Admitting: Internal Medicine

## 2022-04-27 VITALS — BP 118/78 | HR 92 | Ht 68.5 in | Wt 209.4 lb

## 2022-04-27 DIAGNOSIS — R079 Chest pain, unspecified: Secondary | ICD-10-CM

## 2022-04-27 DIAGNOSIS — F331 Major depressive disorder, recurrent, moderate: Secondary | ICD-10-CM | POA: Diagnosis not present

## 2022-04-27 DIAGNOSIS — F411 Generalized anxiety disorder: Secondary | ICD-10-CM | POA: Diagnosis not present

## 2022-04-27 NOTE — Patient Instructions (Addendum)
Medication Instructions:  Your physician recommends that you continue on your current medications as directed. Please refer to the Current Medication list given to you today.  *If you need a refill on your cardiac medications before your next appointment, please call your pharmacy*   Lab Work: None   Testing/Procedures: You will be scheduled for an Exercise Stress Test.     Please arrive 15 minutes prior to your appointment time for registration and insurance purposes.   The test will take approximately 45 minutes to complete.   How to prepare for your Exercise Stress Test: Do bring a list of your current medications with you.  If not listed below, you may take your medications as normal. Do wear comfortable clothes (no dresses or overalls) and walking shoes, tennis shoes preferred (no heels or open toed shoes are allowed) Do Not wear cologne, perfume, aftershave or lotions (deodorant is allowed). Please report to [address], [address] for your test.   If these instructions are not followed, your test will have to be rescheduled.   If you have questions or concerns about your appointment, you can call the Stress Lab at [phone].   If you cannot keep your appointment, please provide 24 hours notification to the Stress Lab, to avoid a possible $50 charge to your account.    Follow-Up: At [facility], you and your health needs are our priority.  As part of our continuing mission to provide you with exceptional heart care, we have created designated Provider Care Teams.  These Care Teams include your primary Cardiologist (physician) and Advanced Practice Providers (APPs -  Physician Assistants and Nurse Practitioners) who all work together to provide you with the care you need, when you need it.  We recommend signing up for the patient portal called "MyChart".  Sign up information is provided on this After Visit Summary.  MyChart is used to connect with patients  for Virtual Visits (Telemedicine).  Patients are able to view lab/test results, encounter notes, upcoming appointments, etc.  Non-urgent messages can be sent to your provider as well.   To learn more about what you can do with MyChart, go to [email or web address].    Your next appointment:    As needed  The format for your next appointment:   In Person  Provider:   Janina Mayo, MD     Other Instructions Steps to Quit Smoking Smoking tobacco is the leading cause of preventable death. It can affect almost every organ in the body. Smoking puts you and people around you at risk for many serious, long-lasting (chronic) diseases. Quitting smoking can be hard, but it is one of the best things that you can do for your health. It is never too late to quit. Do not give up if you cannot quit the first time. Some people need to try many times to quit. Do your best to stick to your quit plan, and talk with your doctor if you have any questions or concerns. How do I get ready to quit? Pick a date to quit. Set a date within the next 2 weeks to give you time to prepare. Write down the reasons why you are quitting. Keep this list in places where you will see it often. Tell your family, friends, and co-workers that you are quitting. Their support is important. Talk with your doctor about the choices that may help you quit. Find out if your health insurance will pay for these treatments. Know the people,  places, things, and activities that make you want to smoke (triggers). Avoid them. What first steps can I take to quit smoking? Throw away all cigarettes at home, at work, and in your car. Throw away the things that you use when you smoke, such as ashtrays and lighters. Clean your car. Empty the ashtray. Clean your home, including curtains and carpets. What can I do to help me quit smoking? Talk with your doctor about taking medicines and seeing a counselor. You are more likely to succeed when you do  both. If you are pregnant or breastfeeding: Talk with your doctor about counseling or other ways to quit smoking. Do not take medicine to help you quit smoking unless your doctor tells you to. Quit right away Quit smoking completely, instead of slowly cutting back on how much you smoke over a period of time. Stopping smoking right away may be more successful than slowly quitting. Go to counseling. In-person is best if this is an option. You are more likely to quit if you go to counseling sessions regularly. Take medicine You may take medicines to help you quit. Some medicines need a prescription, and some you can buy over-the-counter. Some medicines may contain a drug called nicotine to replace the nicotine in cigarettes. Medicines may: Help you stop having the desire to smoke (cravings). Help to stop the problems that come when you stop smoking (withdrawal symptoms). Your doctor may ask you to use: Nicotine patches, gum, or lozenges. Nicotine inhalers or sprays. Non-nicotine medicine that you take by mouth. Find resources Find resources and other ways to help you quit smoking and remain smoke-free after you quit. They include: Online chats with a Veterinary surgeon. Phone quitlines. Printed Materials engineer. Support groups or group counseling. Text messaging programs. Mobile phone apps. Use apps on your mobile phone or tablet that can help you stick to your quit plan. Examples of free services include Quit Guide from the CDC and smokefree.gov  What can I do to make it easier to quit?  Talk to your family and friends. Ask them to support and encourage you. Call a phone quitline, such as 1-800-QUIT-NOW, reach out to support groups, or work with a Veterinary surgeon. Ask people who smoke to not smoke around you. Avoid places that make you want to smoke, such as: Bars. Parties. Smoke-break areas at work. Spend time with people who do not smoke. Lower the stress in your life. Stress can make you want  to smoke. Try these things to lower stress: Getting regular exercise. Doing deep-breathing exercises. Doing yoga. Meditating. What benefits will I see if I quit smoking? Over time, you may have: A better sense of smell and taste. Less coughing and sore throat. A slower heart rate. Lower blood pressure. Clearer skin. Better breathing. Fewer sick days. Summary Quitting smoking can be hard, but it is one of the best things that you can do for your health. Do not give up if you cannot quit the first time. Some people need to try many times to quit. When you decide to quit smoking, make a plan to help you succeed. Quit smoking right away, not slowly over a period of time. When you start quitting, get help and support to keep you smoke-free. This information is not intended to replace advice given to you by your health care provider. Make sure you discuss any questions you have with your health care provider. Document Revised: 07/09/2021 Document Reviewed: 07/09/2021 Elsevier Patient Education  2023 [employer].   Important  Information About Sugar

## 2022-04-27 NOTE — Progress Notes (Signed)
Cardiology Office Note:    Date:  04/27/2022   ID:  Derrick Webb, DOB 30-Aug-1970, MRN [id number]  PCP:  Derrick Poling, DO   [facility] Providers Cardiologist:  Derrick Fus, MD     Referring MD: Derrick Poling, DO   No chief complaint on file. Atypical CP  History of Present Illness:    Derrick Webb is a 51 y.o. male with a hx of anxiety , sleep apnea not wearing CPAP , smoker,  referral to cardiology for epigastic pain and dizziness from [place] [facility]. He notes pain for 5-20 minutes. Started PPI. Just sitting there and laying. If he massages his chest that helps. He is not that active. He does some walking. No chest pressure. No significant DOE. No cardiac cath or stress test. He is a smoker  He saw Derrick Webb in 2018 for palpitations.  He had a 30 day event monitor that showed sinus rhythm.   Family Hx: father has HTN.  Past Medical History:  Diagnosis Date   Palpitations 10/19/2016   Shortness of breath 10/19/2016   Sleep apnea 10/19/2016   Tobacco abuse 10/19/2016    History reviewed. No pertinent surgical history.  Current Medications: Current Meds  Medication Sig   CREON 36000-114000 units CPEP capsule Take by mouth. Not yet started, on back order   FLUoxetine (PROZAC) 10 MG capsule Take 1 capsule by mouth daily.   pantoprazole (PROTONIX) 20 MG tablet Take 20 mg by mouth daily.     Allergies:   Patient has no known allergies.   Social History   Socioeconomic History   Marital status: Married    Spouse name: Not on file   Number of children: Not on file   Years of education: Not on file   Highest education level: Not on file  Occupational History   Not on file  Tobacco Use   Smoking status: Every Day   Smokeless tobacco: Never  Substance and Sexual Activity   Alcohol use: Yes    Comment: occ   Drug use: No   Sexual activity: Not on file  Other Topics Concern   Not on file  Social History Narrative   Not on file   Social  Determinants of Health   Financial Resource Strain: Not on file  Food Insecurity: Not on file  Transportation Needs: Not on file  Physical Activity: Not on file  Stress: Not on file  Social Connections: Not on file     Family History: The patient's family history includes Dementia in his maternal grandmother and mother; Hypertension in his father; Sleep apnea in his brother.  ROS:   Please see the history of present illness.     All other systems reviewed and are negative.  EKGs/Labs/Other Studies Reviewed:    The following studies were reviewed today:   EKG:  EKG is  ordered today.  The ekg ordered today demonstrates   04/27/2022- NSR  Recent Labs: No results found for requested labs within last 365 days.   Recent Lipid Panel No results found for: "CHOL", "TRIG", "HDL", "CHOLHDL", "VLDL", "LDLCALC", "LDLDIRECT"   Risk Assessment/Calculations:     Physical Exam:    VS Vitals:   04/27/22 1539  BP: 118/78  Pulse: 92  SpO2: 94%     Wt Readings from Last 3 Encounters:  04/27/22 209 lb 6.4 oz (95 kg)  12/28/20 205 lb (93 kg)  09/16/17 201 lb (91.2 kg)     GEN:  Well nourished, well developed in no acute distress HEENT: Normal NECK: No JVD; No carotid bruits LYMPHATICS: No lymphadenopathy CARDIAC: RRR, no murmurs, rubs, gallops RESPIRATORY:  Clear to auscultation without rales, wheezing or rhonchi  ABDOMEN: Soft, non-tender, non-distended MUSCULOSKELETAL:  No edema; No deformity  SKIN: Warm and dry NEUROLOGIC:  Alert and oriented x 3 PSYCHIATRIC:  Normal affect   ASSESSMENT:    Non cardiac CP: he is very concerned so will plan for POET. Recommend smoking cessation  Sleep apnea: recommend CPAP. He does not want to sleep on his back. Doesn't want inspire  PLAN:    In order of problems listed above:  POET Follow up PRN      Shared Decision Making/Informed Consent The risks [chest pain, shortness of breath, cardiac arrhythmias, dizziness, blood  pressure fluctuations, myocardial infarction, stroke/transient ischemic attack, and life-threatening complications (estimated to be 1 in 10,000)], benefits (risk stratification, diagnosing coronary artery disease, treatment guidance) and alternatives of an exercise tolerance test were discussed in detail with Derrick Webb and he agrees to proceed.    Medication Adjustments/Labs and Tests Ordered: Current medicines are reviewed at length with the patient today.  Concerns regarding medicines are outlined above.  Orders Placed This Encounter  Procedures   Exercise Tolerance Test   EKG 12-Lead   No orders of the defined types were placed in this encounter.   Patient Instructions  Medication Instructions:  Your physician recommends that you continue on your current medications as directed. Please refer to the Current Medication list given to you today.  *If you need a refill on your cardiac medications before your next appointment, please call your pharmacy*   Lab Work: None   Testing/Procedures: You will be scheduled for an Exercise Stress Test.     Please arrive 15 minutes prior to your appointment time for registration and insurance purposes.   The test will take approximately 45 minutes to complete.   How to prepare for your Exercise Stress Test: Do bring a list of your current medications with you.  If not listed below, you may take your medications as normal. Do wear comfortable clothes (no dresses or overalls) and walking shoes, tennis shoes preferred (no heels or open toed shoes are allowed) Do Not wear cologne, perfume, aftershave or lotions (deodorant is allowed). Please report to [address] [facility], [address] for your test.   If these instructions are not followed, your test will have to be rescheduled.   If you have questions or concerns about your appointment, you can call the Stress Lab at [phone].   If you cannot keep your appointment, please provide 24 hours  notification to the Stress Lab, to avoid a possible $50 charge to your account.    Follow-Up: At [facility], you and your health needs are our priority.  As part of our continuing mission to provide you with exceptional heart care, we have created designated Provider Care Teams.  These Care Teams include your primary Cardiologist (physician) and Advanced Practice Providers (APPs -  Physician Assistants and Nurse Practitioners) who all work together to provide you with the care you need, when you need it.  We recommend signing up for the patient portal called "MyChart".  Sign up information is provided on this After Visit Summary.  MyChart is used to connect with patients for Virtual Visits (Telemedicine).  Patients are able to view lab/test results, encounter notes, upcoming appointments, etc.  Non-urgent messages can be sent to your provider as well.  To learn more about what you can do with MyChart, go to [email or web address].    Your next appointment:    As needed  The format for your next appointment:   In Person  Provider:   Maisie Fus, MD     Other Instructions Steps to Quit Smoking Smoking tobacco is the leading cause of preventable death. It can affect almost every organ in the body. Smoking puts you and people around you at risk for many serious, long-lasting (chronic) diseases. Quitting smoking can be hard, but it is one of the best things that you can do for your health. It is never too late to quit. Do not give up if you cannot quit the first time. Some people need to try many times to quit. Do your best to stick to your quit plan, and talk with your doctor if you have any questions or concerns. How do I get ready to quit? Pick a date to quit. Set a date within the next 2 weeks to give you time to prepare. Write down the reasons why you are quitting. Keep this list in places where you will see it often. Tell your family, friends, and co-workers that you are  quitting. Their support is important. Talk with your doctor about the choices that may help you quit. Find out if your health insurance will pay for these treatments. Know the people, places, things, and activities that make you want to smoke (triggers). Avoid them. What first steps can I take to quit smoking? Throw away all cigarettes at home, at work, and in your car. Throw away the things that you use when you smoke, such as ashtrays and lighters. Clean your car. Empty the ashtray. Clean your home, including curtains and carpets. What can I do to help me quit smoking? Talk with your doctor about taking medicines and seeing a counselor. You are more likely to succeed when you do both. If you are pregnant or breastfeeding: Talk with your doctor about counseling or other ways to quit smoking. Do not take medicine to help you quit smoking unless your doctor tells you to. Quit right away Quit smoking completely, instead of slowly cutting back on how much you smoke over a period of time. Stopping smoking right away may be more successful than slowly quitting. Go to counseling. In-person is best if this is an option. You are more likely to quit if you go to counseling sessions regularly. Take medicine You may take medicines to help you quit. Some medicines need a prescription, and some you can buy over-the-counter. Some medicines may contain a drug called nicotine to replace the nicotine in cigarettes. Medicines may: Help you stop having the desire to smoke (cravings). Help to stop the problems that come when you stop smoking (withdrawal symptoms). Your doctor may ask you to use: Nicotine patches, gum, or lozenges. Nicotine inhalers or sprays. Non-nicotine medicine that you take by mouth. Find resources Find resources and other ways to help you quit smoking and remain smoke-free after you quit. They include: Online chats with a Veterinary surgeon. Phone quitlines. Printed Chief Executive Officer. Support groups or group counseling. Text messaging programs. Mobile phone apps. Use apps on your mobile phone or tablet that can help you stick to your quit plan. Examples of free services include Quit Guide from the CDC and smokefree.gov  What can I do to make it easier to quit?  Talk to your family and friends. Ask them to support and encourage you. Call  a phone quitline, such as 1-800-QUIT-NOW, reach out to support groups, or work with a Social worker. Ask people who smoke to not smoke around you. Avoid places that make you want to smoke, such as: Bars. Parties. Smoke-break areas at work. Spend time with people who do not smoke. Lower the stress in your life. Stress can make you want to smoke. Try these things to lower stress: Getting regular exercise. Doing deep-breathing exercises. Doing yoga. Meditating. What benefits will I see if I quit smoking? Over time, you may have: A better sense of smell and taste. Less coughing and sore throat. A slower heart rate. Lower blood pressure. Clearer skin. Better breathing. Fewer sick days. Summary Quitting smoking can be hard, but it is one of the best things that you can do for your health. Do not give up if you cannot quit the first time. Some people need to try many times to quit. When you decide to quit smoking, make a plan to help you succeed. Quit smoking right away, not slowly over a period of time. When you start quitting, get help and support to keep you smoke-free. This information is not intended to replace advice given to you by your health care provider. Make sure you discuss any questions you have with your health care provider. Document Revised: 07/09/2021 Document Reviewed: 07/09/2021 Elsevier Patient Education  [place]         Signed, Janina Mayo, MD  04/27/2022 4:21 PM    [place]

## 2022-05-10 ENCOUNTER — Telehealth (HOSPITAL_COMMUNITY): Payer: Self-pay | Admitting: *Deleted

## 2022-05-10 NOTE — Telephone Encounter (Signed)
Close encounter 

## 2022-05-11 ENCOUNTER — Ambulatory Visit (HOSPITAL_COMMUNITY)
Admission: RE | Admit: 2022-05-11 | Discharge: 2022-05-11 | Disposition: A | Discharge status: Home / Self Care | Payer: Federal, State, Local not specified - PPO | Source: Ambulatory Visit | Attending: Internal Medicine | Admitting: Internal Medicine

## 2022-05-11 DIAGNOSIS — R079 Chest pain, unspecified: Secondary | ICD-10-CM | POA: Diagnosis not present

## 2022-05-11 LAB — EXERCISE TOLERANCE TEST
Angina Index: 0
Duke Treadmill Score: 9
Estimated workload: 10.7
Exercise duration (min): 9 min
Exercise duration (sec): 23 s
MPHR: 170 {beats}/min
Peak HR: 164 {beats}/min
Percent HR: 96 %
Rest HR: 77 {beats}/min
ST Depression (mm): 0 mm

## 2022-05-18 DIAGNOSIS — F411 Generalized anxiety disorder: Secondary | ICD-10-CM | POA: Diagnosis not present

## 2022-05-18 DIAGNOSIS — F331 Major depressive disorder, recurrent, moderate: Secondary | ICD-10-CM | POA: Diagnosis not present

## 2022-05-25 DIAGNOSIS — J0100 Acute maxillary sinusitis, unspecified: Secondary | ICD-10-CM | POA: Diagnosis not present

## 2022-06-29 DIAGNOSIS — F411 Generalized anxiety disorder: Secondary | ICD-10-CM | POA: Diagnosis not present

## 2022-06-29 DIAGNOSIS — F331 Major depressive disorder, recurrent, moderate: Secondary | ICD-10-CM | POA: Diagnosis not present

## 2022-07-07 DIAGNOSIS — R55 Syncope and collapse: Secondary | ICD-10-CM | POA: Diagnosis not present

## 2022-07-07 DIAGNOSIS — G4733 Obstructive sleep apnea (adult) (pediatric): Secondary | ICD-10-CM | POA: Diagnosis not present

## 2022-07-07 DIAGNOSIS — K219 Gastro-esophageal reflux disease without esophagitis: Secondary | ICD-10-CM | POA: Diagnosis not present

## 2023-04-27 DIAGNOSIS — Z1159 Encounter for screening for other viral diseases: Secondary | ICD-10-CM | POA: Diagnosis not present

## 2023-04-27 DIAGNOSIS — Z0000 Encounter for general adult medical examination without abnormal findings: Secondary | ICD-10-CM | POA: Diagnosis not present

## 2023-04-27 DIAGNOSIS — E559 Vitamin D deficiency, unspecified: Secondary | ICD-10-CM | POA: Diagnosis not present

## 2023-04-27 DIAGNOSIS — Z125 Encounter for screening for malignant neoplasm of prostate: Secondary | ICD-10-CM | POA: Diagnosis not present

## 2023-04-27 DIAGNOSIS — Z13220 Encounter for screening for lipoid disorders: Secondary | ICD-10-CM | POA: Diagnosis not present

## 2023-12-18 DIAGNOSIS — K080 Exfoliation of teeth due to systemic causes: Secondary | ICD-10-CM | POA: Diagnosis not present

## 2024-01-18 DIAGNOSIS — R1010 Upper abdominal pain, unspecified: Secondary | ICD-10-CM | POA: Diagnosis not present

## 2024-01-18 DIAGNOSIS — R198 Other specified symptoms and signs involving the digestive system and abdomen: Secondary | ICD-10-CM | POA: Diagnosis not present

## 2024-01-18 DIAGNOSIS — R109 Unspecified abdominal pain: Secondary | ICD-10-CM | POA: Diagnosis not present

## 2024-02-08 DIAGNOSIS — K5730 Diverticulosis of large intestine without perforation or abscess without bleeding: Secondary | ICD-10-CM | POA: Diagnosis not present

## 2024-02-08 DIAGNOSIS — R1031 Right lower quadrant pain: Secondary | ICD-10-CM | POA: Diagnosis not present

## 2024-02-08 DIAGNOSIS — K5900 Constipation, unspecified: Secondary | ICD-10-CM | POA: Diagnosis not present

## 2024-02-08 DIAGNOSIS — K635 Polyp of colon: Secondary | ICD-10-CM | POA: Diagnosis not present

## 2024-02-08 DIAGNOSIS — R1032 Left lower quadrant pain: Secondary | ICD-10-CM | POA: Diagnosis not present

## 2024-02-08 DIAGNOSIS — K648 Other hemorrhoids: Secondary | ICD-10-CM | POA: Diagnosis not present

## 2024-03-22 DIAGNOSIS — K219 Gastro-esophageal reflux disease without esophagitis: Secondary | ICD-10-CM | POA: Diagnosis not present

## 2024-03-22 DIAGNOSIS — R109 Unspecified abdominal pain: Secondary | ICD-10-CM | POA: Diagnosis not present

## 2024-03-22 DIAGNOSIS — R1010 Upper abdominal pain, unspecified: Secondary | ICD-10-CM | POA: Diagnosis not present

## 2024-05-14 DIAGNOSIS — Z125 Encounter for screening for malignant neoplasm of prostate: Secondary | ICD-10-CM | POA: Diagnosis not present

## 2024-05-14 DIAGNOSIS — E559 Vitamin D deficiency, unspecified: Secondary | ICD-10-CM | POA: Diagnosis not present

## 2024-05-14 DIAGNOSIS — Z0000 Encounter for general adult medical examination without abnormal findings: Secondary | ICD-10-CM | POA: Diagnosis not present

## 2024-06-11 DIAGNOSIS — R109 Unspecified abdominal pain: Secondary | ICD-10-CM | POA: Diagnosis not present

## 2024-06-11 DIAGNOSIS — K219 Gastro-esophageal reflux disease without esophagitis: Secondary | ICD-10-CM | POA: Diagnosis not present

## 2024-06-11 DIAGNOSIS — R1010 Upper abdominal pain, unspecified: Secondary | ICD-10-CM | POA: Diagnosis not present

## 2024-07-11 DIAGNOSIS — K219 Gastro-esophageal reflux disease without esophagitis: Secondary | ICD-10-CM | POA: Diagnosis not present

## 2024-07-11 DIAGNOSIS — K2930 Chronic superficial gastritis without bleeding: Secondary | ICD-10-CM | POA: Diagnosis not present

## 2024-07-11 DIAGNOSIS — R1010 Upper abdominal pain, unspecified: Secondary | ICD-10-CM | POA: Diagnosis not present
